# Patient Record
Sex: Male | Born: 1940 | Race: White | Hispanic: No | Marital: Married | Smoking: Current every day smoker
Health system: Southern US, Community
[De-identification: ages and names within clinical notes are randomized; demographics above are authoritative.]

## PROBLEM LIST (undated history)

## (undated) DIAGNOSIS — F32A Depression, unspecified: Secondary | ICD-10-CM

## (undated) DIAGNOSIS — Z720 Tobacco use: Secondary | ICD-10-CM

## (undated) DIAGNOSIS — N400 Enlarged prostate without lower urinary tract symptoms: Secondary | ICD-10-CM

## (undated) DIAGNOSIS — I471 Supraventricular tachycardia: Secondary | ICD-10-CM

## (undated) DIAGNOSIS — Z8673 Personal history of transient ischemic attack (TIA), and cerebral infarction without residual deficits: Secondary | ICD-10-CM

## (undated) DIAGNOSIS — D649 Anemia, unspecified: Secondary | ICD-10-CM

## (undated) DIAGNOSIS — J189 Pneumonia, unspecified organism: Secondary | ICD-10-CM

## (undated) DIAGNOSIS — F329 Major depressive disorder, single episode, unspecified: Secondary | ICD-10-CM

## (undated) DIAGNOSIS — C439 Malignant melanoma of skin, unspecified: Secondary | ICD-10-CM

## (undated) DIAGNOSIS — G8929 Other chronic pain: Secondary | ICD-10-CM

## (undated) HISTORY — PX: SKIN SURGERY: SHX2413

---

## 2004-09-28 ENCOUNTER — Ambulatory Visit: Payer: Self-pay | Admitting: Pain Medicine

## 2004-10-04 ENCOUNTER — Ambulatory Visit: Payer: Self-pay | Admitting: Pain Medicine

## 2004-10-06 ENCOUNTER — Ambulatory Visit: Payer: Self-pay | Admitting: Gastroenterology

## 2004-11-16 ENCOUNTER — Ambulatory Visit: Payer: Self-pay | Admitting: Pain Medicine

## 2004-11-20 ENCOUNTER — Ambulatory Visit: Payer: Self-pay | Admitting: Pain Medicine

## 2005-01-18 ENCOUNTER — Ambulatory Visit: Payer: Self-pay | Admitting: Pain Medicine

## 2005-02-13 ENCOUNTER — Ambulatory Visit: Payer: Self-pay | Admitting: Pain Medicine

## 2005-02-19 ENCOUNTER — Ambulatory Visit: Payer: Self-pay | Admitting: Pain Medicine

## 2005-03-13 ENCOUNTER — Ambulatory Visit: Payer: Self-pay | Admitting: Pain Medicine

## 2005-03-21 ENCOUNTER — Ambulatory Visit: Payer: Self-pay | Admitting: Pain Medicine

## 2005-04-19 ENCOUNTER — Ambulatory Visit: Payer: Self-pay | Admitting: Pain Medicine

## 2005-05-29 ENCOUNTER — Ambulatory Visit: Payer: Self-pay | Admitting: Pain Medicine

## 2005-06-28 ENCOUNTER — Ambulatory Visit: Payer: Self-pay | Admitting: Pain Medicine

## 2005-07-26 ENCOUNTER — Ambulatory Visit: Payer: Self-pay | Admitting: Pain Medicine

## 2005-08-01 ENCOUNTER — Ambulatory Visit: Payer: Self-pay | Admitting: Pain Medicine

## 2005-09-20 ENCOUNTER — Ambulatory Visit: Payer: Self-pay | Admitting: Pain Medicine

## 2005-09-24 ENCOUNTER — Ambulatory Visit: Payer: Self-pay | Admitting: Pain Medicine

## 2005-11-20 ENCOUNTER — Ambulatory Visit: Payer: Self-pay | Admitting: Pain Medicine

## 2005-12-27 ENCOUNTER — Ambulatory Visit: Payer: Self-pay | Admitting: Pain Medicine

## 2006-01-16 ENCOUNTER — Ambulatory Visit: Payer: Self-pay | Admitting: Pain Medicine

## 2006-02-12 ENCOUNTER — Ambulatory Visit: Payer: Self-pay | Admitting: Pain Medicine

## 2006-02-18 ENCOUNTER — Ambulatory Visit: Payer: Self-pay | Admitting: Pain Medicine

## 2006-03-12 ENCOUNTER — Ambulatory Visit: Payer: Self-pay | Admitting: Pain Medicine

## 2006-04-09 ENCOUNTER — Ambulatory Visit: Payer: Self-pay | Admitting: Pain Medicine

## 2006-05-06 ENCOUNTER — Ambulatory Visit: Payer: Self-pay | Admitting: Pain Medicine

## 2006-06-18 ENCOUNTER — Ambulatory Visit: Payer: Self-pay | Admitting: Pain Medicine

## 2006-07-23 ENCOUNTER — Ambulatory Visit: Payer: Self-pay | Admitting: Pain Medicine

## 2006-08-22 ENCOUNTER — Ambulatory Visit: Payer: Self-pay | Admitting: Pain Medicine

## 2006-11-05 ENCOUNTER — Ambulatory Visit: Payer: Self-pay | Admitting: Pain Medicine

## 2006-12-24 ENCOUNTER — Ambulatory Visit: Payer: Self-pay | Admitting: Pain Medicine

## 2007-01-01 ENCOUNTER — Ambulatory Visit: Payer: Self-pay | Admitting: Pain Medicine

## 2007-02-18 ENCOUNTER — Ambulatory Visit: Payer: Self-pay | Admitting: Pain Medicine

## 2007-03-27 ENCOUNTER — Ambulatory Visit: Payer: Self-pay | Admitting: Pain Medicine

## 2007-04-02 ENCOUNTER — Ambulatory Visit: Payer: Self-pay | Admitting: Pain Medicine

## 2007-04-24 ENCOUNTER — Ambulatory Visit: Payer: Self-pay | Admitting: Pain Medicine

## 2007-06-26 ENCOUNTER — Ambulatory Visit: Payer: Self-pay | Admitting: Pain Medicine

## 2007-06-30 ENCOUNTER — Ambulatory Visit: Payer: Self-pay | Admitting: Pain Medicine

## 2007-08-19 ENCOUNTER — Ambulatory Visit: Payer: Self-pay | Admitting: Pain Medicine

## 2007-09-17 ENCOUNTER — Ambulatory Visit: Payer: Self-pay | Admitting: Pain Medicine

## 2007-12-23 ENCOUNTER — Ambulatory Visit: Payer: Self-pay | Admitting: Pain Medicine

## 2007-12-29 ENCOUNTER — Ambulatory Visit: Payer: Self-pay | Admitting: Pain Medicine

## 2008-02-03 ENCOUNTER — Ambulatory Visit: Payer: Self-pay | Admitting: Pain Medicine

## 2008-02-20 ENCOUNTER — Ambulatory Visit: Payer: Self-pay | Admitting: Pain Medicine

## 2008-04-15 ENCOUNTER — Ambulatory Visit: Payer: Self-pay | Admitting: Pain Medicine

## 2008-05-18 ENCOUNTER — Ambulatory Visit: Payer: Self-pay | Admitting: Pain Medicine

## 2008-06-24 ENCOUNTER — Ambulatory Visit: Payer: Self-pay | Admitting: Pain Medicine

## 2008-06-26 ENCOUNTER — Emergency Department: Payer: Self-pay | Admitting: Emergency Medicine

## 2008-07-07 ENCOUNTER — Ambulatory Visit: Payer: Self-pay | Admitting: Internal Medicine

## 2008-08-02 ENCOUNTER — Ambulatory Visit: Payer: Self-pay | Admitting: Pain Medicine

## 2008-09-07 ENCOUNTER — Ambulatory Visit: Payer: Self-pay | Admitting: Pain Medicine

## 2008-09-30 ENCOUNTER — Ambulatory Visit: Payer: Self-pay | Admitting: Pain Medicine

## 2008-10-07 ENCOUNTER — Ambulatory Visit: Payer: Self-pay | Admitting: Internal Medicine

## 2008-10-11 ENCOUNTER — Ambulatory Visit: Payer: Self-pay | Admitting: Pain Medicine

## 2009-02-22 ENCOUNTER — Ambulatory Visit: Payer: Self-pay | Admitting: Pain Medicine

## 2012-01-18 DIAGNOSIS — J189 Pneumonia, unspecified organism: Secondary | ICD-10-CM

## 2012-01-18 DIAGNOSIS — Z8673 Personal history of transient ischemic attack (TIA), and cerebral infarction without residual deficits: Secondary | ICD-10-CM

## 2012-01-18 HISTORY — DX: Pneumonia, unspecified organism: J18.9

## 2012-01-18 HISTORY — DX: Personal history of transient ischemic attack (TIA), and cerebral infarction without residual deficits: Z86.73

## 2012-02-01 ENCOUNTER — Emergency Department (HOSPITAL_COMMUNITY): Payer: Medicare Other

## 2012-02-01 ENCOUNTER — Inpatient Hospital Stay (HOSPITAL_COMMUNITY)
Admission: EM | Admit: 2012-02-01 | Discharge: 2012-02-03 | DRG: 194 | Disposition: A | Discharge status: Home / Self Care | Payer: Medicare Other | Attending: Internal Medicine | Admitting: Internal Medicine

## 2012-02-01 ENCOUNTER — Other Ambulatory Visit: Payer: Self-pay

## 2012-02-01 ENCOUNTER — Encounter (HOSPITAL_COMMUNITY): Payer: Self-pay

## 2012-02-01 DIAGNOSIS — Z7982 Long term (current) use of aspirin: Secondary | ICD-10-CM

## 2012-02-01 DIAGNOSIS — D649 Anemia, unspecified: Secondary | ICD-10-CM | POA: Diagnosis not present

## 2012-02-01 DIAGNOSIS — R410 Disorientation, unspecified: Secondary | ICD-10-CM

## 2012-02-01 DIAGNOSIS — Z885 Allergy status to narcotic agent status: Secondary | ICD-10-CM

## 2012-02-01 DIAGNOSIS — M25519 Pain in unspecified shoulder: Secondary | ICD-10-CM | POA: Diagnosis present

## 2012-02-01 DIAGNOSIS — N400 Enlarged prostate without lower urinary tract symptoms: Secondary | ICD-10-CM | POA: Diagnosis present

## 2012-02-01 DIAGNOSIS — F3289 Other specified depressive episodes: Secondary | ICD-10-CM | POA: Diagnosis present

## 2012-02-01 DIAGNOSIS — J189 Pneumonia, unspecified organism: Principal | ICD-10-CM | POA: Diagnosis present

## 2012-02-01 DIAGNOSIS — Z8673 Personal history of transient ischemic attack (TIA), and cerebral infarction without residual deficits: Secondary | ICD-10-CM

## 2012-02-01 DIAGNOSIS — G8929 Other chronic pain: Secondary | ICD-10-CM | POA: Diagnosis present

## 2012-02-01 DIAGNOSIS — E236 Other disorders of pituitary gland: Secondary | ICD-10-CM | POA: Diagnosis present

## 2012-02-01 DIAGNOSIS — Z720 Tobacco use: Secondary | ICD-10-CM | POA: Diagnosis present

## 2012-02-01 DIAGNOSIS — R5383 Other fatigue: Secondary | ICD-10-CM | POA: Diagnosis present

## 2012-02-01 DIAGNOSIS — F329 Major depressive disorder, single episode, unspecified: Secondary | ICD-10-CM | POA: Diagnosis present

## 2012-02-01 DIAGNOSIS — R413 Other amnesia: Secondary | ICD-10-CM | POA: Diagnosis present

## 2012-02-01 DIAGNOSIS — J449 Chronic obstructive pulmonary disease, unspecified: Secondary | ICD-10-CM | POA: Diagnosis present

## 2012-02-01 DIAGNOSIS — R0902 Hypoxemia: Secondary | ICD-10-CM | POA: Diagnosis present

## 2012-02-01 DIAGNOSIS — T50905A Adverse effect of unspecified drugs, medicaments and biological substances, initial encounter: Secondary | ICD-10-CM

## 2012-02-01 DIAGNOSIS — J4489 Other specified chronic obstructive pulmonary disease: Secondary | ICD-10-CM | POA: Diagnosis present

## 2012-02-01 DIAGNOSIS — R5381 Other malaise: Secondary | ICD-10-CM | POA: Diagnosis present

## 2012-02-01 DIAGNOSIS — Z85820 Personal history of malignant melanoma of skin: Secondary | ICD-10-CM

## 2012-02-01 DIAGNOSIS — Z79899 Other long term (current) drug therapy: Secondary | ICD-10-CM

## 2012-02-01 DIAGNOSIS — R531 Weakness: Secondary | ICD-10-CM | POA: Diagnosis present

## 2012-02-01 DIAGNOSIS — F17200 Nicotine dependence, unspecified, uncomplicated: Secondary | ICD-10-CM | POA: Diagnosis present

## 2012-02-01 DIAGNOSIS — E860 Dehydration: Secondary | ICD-10-CM

## 2012-02-01 DIAGNOSIS — E871 Hypo-osmolality and hyponatremia: Secondary | ICD-10-CM | POA: Diagnosis present

## 2012-02-01 HISTORY — DX: Personal history of transient ischemic attack (TIA), and cerebral infarction without residual deficits: Z86.73

## 2012-02-01 HISTORY — DX: Anemia, unspecified: D64.9

## 2012-02-01 HISTORY — DX: Tobacco use: Z72.0

## 2012-02-01 HISTORY — DX: Benign prostatic hyperplasia without lower urinary tract symptoms: N40.0

## 2012-02-01 HISTORY — DX: Major depressive disorder, single episode, unspecified: F32.9

## 2012-02-01 HISTORY — DX: Other chronic pain: G89.29

## 2012-02-01 HISTORY — DX: Malignant melanoma of skin, unspecified: C43.9

## 2012-02-01 HISTORY — DX: Depression, unspecified: F32.A

## 2012-02-01 HISTORY — DX: Pneumonia, unspecified organism: J18.9

## 2012-02-01 LAB — URINALYSIS, ROUTINE W REFLEX MICROSCOPIC
Glucose, UA: NEGATIVE mg/dL
Leukocytes, UA: NEGATIVE
Specific Gravity, Urine: 1.025 (ref 1.005–1.030)
pH: 5.5 (ref 5.0–8.0)

## 2012-02-01 LAB — CBC
HCT: 42.3 % (ref 39.0–52.0)
Hemoglobin: 14.7 g/dL (ref 13.0–17.0)
RDW: 19.2 % — ABNORMAL HIGH (ref 11.5–15.5)
WBC: 13.4 10*3/uL — ABNORMAL HIGH (ref 4.0–10.5)

## 2012-02-01 LAB — COMPREHENSIVE METABOLIC PANEL
ALT: 10 U/L (ref 0–53)
AST: 16 U/L (ref 0–37)
Albumin: 3.6 g/dL (ref 3.5–5.2)
CO2: 28 mEq/L (ref 19–32)
Calcium: 10 mg/dL (ref 8.4–10.5)
GFR calc non Af Amer: 67 mL/min — ABNORMAL LOW (ref >90)
Sodium: 131 mEq/L — ABNORMAL LOW (ref 135–145)

## 2012-02-01 LAB — URINE MICROSCOPIC-ADD ON

## 2012-02-01 LAB — DIFFERENTIAL
Band Neutrophils: 0 % (ref 0–10)
Basophils Relative: 0 % (ref 0–1)
LUC, Absolute: 0 10*3/uL (ref 0.0–0.5)
Lymphs Abs: 1.7 10*3/uL (ref 0.7–4.0)
Monocytes Relative: 12 % (ref 3–12)
Neutro Abs: 10.1 10*3/uL — ABNORMAL HIGH (ref 1.7–7.7)

## 2012-02-01 MED ORDER — DEXTROSE 5 % IV SOLN
1.0000 g | Freq: Once | INTRAVENOUS | Status: AC
  Administered 2012-02-01: 1 g via INTRAVENOUS
  Filled 2012-02-01: qty 10

## 2012-02-01 MED ORDER — SODIUM CHLORIDE 0.9 % IV SOLN
INTRAVENOUS | Status: DC
  Administered 2012-02-02 – 2012-02-03 (×2): via INTRAVENOUS

## 2012-02-01 MED ORDER — SODIUM CHLORIDE 0.9 % IV BOLUS (SEPSIS)
700.0000 mL | Freq: Once | INTRAVENOUS | Status: AC
  Administered 2012-02-01: 700 mL via INTRAVENOUS

## 2012-02-01 MED ORDER — KETOROLAC TROMETHAMINE 30 MG/ML IJ SOLN
30.0000 mg | Freq: Once | INTRAMUSCULAR | Status: AC
  Administered 2012-02-01: 30 mg via INTRAVENOUS
  Filled 2012-02-01: qty 1

## 2012-02-01 MED ORDER — AZITHROMYCIN 250 MG PO TABS
500.0000 mg | ORAL_TABLET | Freq: Once | ORAL | Status: AC
  Administered 2012-02-01: 500 mg via ORAL
  Filled 2012-02-01: qty 2

## 2012-02-01 NOTE — ED Provider Notes (Cosign Needed)
History     CSN: [id number]  Arrival date & time 02/01/12  1821   First MD Initiated Contact with Patient 02/01/12 1909      Chief Complaint  Patient presents with  . Anxiety  . Tremors  . Altered Mental Status    (Consider location/radiation/quality/duration/timing/severity/associated sxs/prior treatment) HPI  Patient here if his wife and son. His wife relates she's had memory problems since last summer. She relates at times he sees seems unsteady on his feet. They were seen by their physician about 3 weeks ago approximately January 21 for his forgetfulness and he was started on Wellbutrin, Neurontin, and Flomax. He relates he was having difficulty with urinary incontinence which has been improved with the Flomax. However family have noticed in the past 7 days he is having worsening memory problems. They state he thinks he ate when he didn't or didn't take his medicine and he did. He also has been stumbling and dragging his feet and shuffling. He seemed anxious and has been having some tremor. Patient's noted to have a cough but they deny fever. He also has not been eating well.  PCP Dr. Graciela Husbands in [place]  Past Medical History  Diagnosis Date  . Chronic pain    melanoma removed from 2 areas on his left shoulder about 2006. 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 No pertinent past surgical history.   No family history on file.  History  Substance Use Topics  . Smoking status: Current Everyday Smoker -- 2.0 packs/day  . Smokeless tobacco: Not on file  . Alcohol Use: Yes     occasional   retired    Review of Systems  All other systems reviewed and are negative.    Allergies  Morphine and related  Home Medications   Current Outpatient Rx  Name Route Sig Dispense Refill  . BUPROPION HCL ER (SR) 150 MG PO TB12 Oral Take 150 mg by mouth 2 (two) times daily.    Marland Kitchen DIPHENHYDRAMINE-APAP (SLEEP) 25-500 MG PO TABS Oral Take 1 tablet by mouth at bedtime as needed.  For sleep/pain    . GABAPENTIN 300 MG PO CAPS Oral Take 300 mg by mouth 5 (five) times daily.    Marland Kitchen TAMSULOSIN HCL 0.4 MG PO CAPS Oral Take 0.4 mg by mouth daily.       BP 139/96  Pulse 40  Temp(Src) 98.5 F (36.9 C) (Oral)  Resp 20  Ht 5\' 11"  (1.803 m)  Wt 138 lb (62.596 kg)  BMI 19.25 kg/m2  SpO2 93%  Vital signs normal    Physical Exam  Nursing note and vitals reviewed. Constitutional: He is oriented to person, place, and time.  Non-toxic appearance. He does not appear ill. No distress.       Thin elderly male who is awake and alert, hyperactive  HENT:  Head: Normocephalic and atraumatic.  Right Ear: External ear normal.  Left Ear: External ear normal.  Nose: Nose normal. No mucosal edema or rhinorrhea.  Mouth/Throat: Mucous membranes are normal. No dental abscesses or uvula swelling.       Glenna Durand is dry  Eyes: Conjunctivae and EOM are normal. Pupils are equal, round, and reactive to light.  Neck: Normal range of motion and full passive range of motion without pain. Neck supple.  Cardiovascular: Normal rate, regular rhythm and normal heart sounds.  Exam reveals no gallop and no friction rub.   No murmur heard. Pulmonary/Chest: Effort normal and breath sounds normal. No respiratory distress.  He has no wheezes. He has no rhonchi. He has no rales. He exhibits no tenderness and no crepitus.         During his exam patient put my hand on his left lateral chest wall and states he's been having pain there. Is no masses felt no crepitus felt  Abdominal: Soft. Normal appearance and bowel sounds are normal. He exhibits no distension. There is no tenderness. There is no rebound and no guarding.  Musculoskeletal: Normal range of motion. He exhibits no edema and no tenderness.       Moves all extremities well.   Neurological: He is alert and oriented to person, place, and time. He has normal strength. No cranial nerve deficit.       Patient has no focal neurological deficit. He has some  minor difficulty with finger to nose finger to finger with the left hand with some mild tremor. Patient noted to ambulate without difficulty in the room  Skin: Skin is warm, dry and intact. No rash noted. No erythema. No pallor.  Psychiatric: His speech is normal and behavior is normal. His mood appears not anxious.       Patient seems a little hyperactive    ED Course  Procedures (including critical care time)   Medications  sodium chloride 0.9 % bolus 700 mL (not administered)  0.9 %  sodium chloride infusion (not administered)  cefTRIAXone (ROCEPHIN) 1 g in dextrose 5 % 50 mL IVPB (not administered)  ketorolac (TORADOL) 30 MG/ML injection 30 mg (not administered)  azithromycin (ZITHROMAX) tablet 500 mg (not administered)   21:57 Dr Rito Ehrlich will admit to tele    Results for orders placed during the hospital encounter of 02/01/12  URINALYSIS, ROUTINE W REFLEX MICROSCOPIC      Component Value Range   Color, Urine YELLOW  YELLOW    APPearance CLEAR  CLEAR    Specific Gravity, Urine 1.025  1.005 - 1.030    pH 5.5  5.0 - 8.0    Glucose, UA NEGATIVE  NEGATIVE (mg/dL)   Hgb urine dipstick TRACE (*) NEGATIVE    Bilirubin Urine NEGATIVE  NEGATIVE    Ketones, ur TRACE (*) NEGATIVE (mg/dL)   Protein, ur TRACE (*) NEGATIVE (mg/dL)   Urobilinogen, UA 0.2  0.0 - 1.0 (mg/dL)   Nitrite NEGATIVE  NEGATIVE    Leukocytes, UA NEGATIVE  NEGATIVE   CBC      Component Value Range   WBC 13.4 (*) 4.0 - 10.5 (K/uL)   RBC 4.28  4.22 - 5.81 (MIL/uL)   Hemoglobin 14.7  13.0 - 17.0 (g/dL)   HCT 78.2  95.6 - 21.3 (%)   MCV 98.8  78.0 - 100.0 (fL)   MCH 34.3 (*) 26.0 - 34.0 (pg)   MCHC 34.8  30.0 - 36.0 (g/dL)   RDW 08.6 (*) 57.8 - 15.5 (%)   Platelets 301  150 - 400 (K/uL)  DIFFERENTIAL      Component Value Range   Neutrophils Relative 75  43 - 77 (%)   Neutro Abs 10.1 (*) 1.7 - 7.7 (K/uL)   Band Neutrophils 0  0 - 10 (%)   Lymphocytes Relative 12  12 - 46 (%)   Lymphs Abs 1.7  0.7 - 4.0  (K/uL)   Monocytes Relative 12  3 - 12 (%)   Monocytes Absolute 1.6 (*) 0.1 - 1.0 (K/uL)   Eosinophils Relative 0  0 - 5 (%)   Eosinophils Absolute 0.0  0.0 - 0.7 (K/uL)  Basophils Relative 0  0 - 1 (%)   Basophils Absolute 0.0  0.0 - 0.1 (K/uL)   LUCs, % 0  0 - 4 (%)   LUC, Absolute 0  0.0 - 0.5 (K/uL)  COMPREHENSIVE METABOLIC PANEL      Component Value Range   Sodium 131 (*) 135 - 145 (mEq/L)   Potassium 4.0  3.5 - 5.1 (mEq/L)   Chloride 92 (*) 96 - 112 (mEq/L)   CO2 28  19 - 32 (mEq/L)   Glucose, Bld 123 (*) 70 - 99 (mg/dL)   BUN 18  6 - 23 (mg/dL)   Creatinine, Ser 9.14  0.50 - 1.35 (mg/dL)   Calcium 78.2  8.4 - 10.5 (mg/dL)   Total Protein 7.7  6.0 - 8.3 (g/dL)   Albumin 3.6  3.5 - 5.2 (g/dL)   AST 16  0 - 37 (U/L)   ALT 10  0 - 53 (U/L)   Alkaline Phosphatase 131 (*) 39 - 117 (U/L)   Total Bilirubin 0.7  0.3 - 1.2 (mg/dL)   GFR calc non Af Amer 67 (*) >90 (mL/min)   GFR calc Af Amer 78 (*) >90 (mL/min)  URINE MICROSCOPIC-ADD ON      Component Value Range   Squamous Epithelial / LPF FEW (*) RARE    WBC, UA 0-2  <3 (WBC/hpf)   RBC / HPF 0-2  <3 (RBC/hpf)   Bacteria, UA RARE  RARE    Laboratory interpretation all normal except leukocytosis, hyponatremia, low chloride, mild hyperglycemia    Dg Chest 2 View  02/01/2012  *RADIOLOGY REPORT*  Clinical Data: Cough.  Shortness of breath and dizziness.  Smoker.  CHEST - 2 VIEW  Comparison: None.  Findings: Underlying hyperinflation/COPD. Midline trachea.  Normal heart size and mediastinal contours for age.  No pleural effusion or pneumothorax.  Probable mild scarring at the apices.  Patchy airspace disease at the right lung base.  IMPRESSION:  1.  Patchy right lower lobe airspace disease, suspicious for infection.  Consider short-term radiographic follow-up. 2.  Underlying COPD/hyperinflation with probable scarring at the apices.  Original Report Authenticated By: Consuello Bossier, M.D.   Ct Head Wo Contrast  02/01/2012   *RADIOLOGY REPORT*  Clinical Data: Confused and weakness.  History of melanoma.  CT HEAD WITHOUT CONTRAST  Technique:  Contiguous axial images were obtained from the base of the skull through the vertex without contrast.  Comparison: None.  Findings: Bone windows demonstrate clear paranasal sinuses and mastoid air cells.  Soft tissue windows demonstrate mildly age advanced cerebral atrophy.  Mild low density in the periventricular white matter likely related to small vessel disease.Remote lacunar infarct in the left caudate head and basil ganglia.  Remote lacunar infarct versus focal atrophy in the right cerebellar hemisphere on image 10.  No hemorrhage, hydrocephalus, acute infarct, intra-axial, or extra-axial fluid collections.  IMPRESSION:  1. No acute intracranial abnormality. 2. Cerebral atrophy and small vessel ischemic change. 3.  Remote lacunar infarct or infarcts.  Original Report Authenticated By: Consuello Bossier, M.D.     Date: 02/01/2012  Rate: 107  Rhythm: normal sinus rhythm  QRS Axis: normal  Intervals: normal  ST/T Wave abnormalities: normal  Conduction Disutrbances:none  Narrative Interpretation: PAC's  Old EKG Reviewed: none available     1. Pneumonia, community acquired   2. Hyponatremia   3. Dehydration   4. Confusion   5. Medication side effects present     Plan admission  Devoria Albe, MD, Armando Gang  MDM          Ward Givens, MD 02/01/12 2204  Ward Givens, MD 02/01/12 2223

## 2012-02-01 NOTE — ED Notes (Signed)
Pt brought in by family for change in mental status, anxiety, and muscle tremors x 5 days. Per family pt was placed on Wellbutrin, Neurontin, and Tamsulosin. In triage pt conversation did not make sense.

## 2012-02-01 NOTE — H&P (Signed)
Corey Hughes is an 71 y.o. male.    PCP: Ricci Barker., MD, MD   Chief Complaint: Weakness, cough, memory problems  HPI: This is a 71 year old, Caucasian male, with a past medical history that is quite unremarkable except for newly diagnosed BPH, for which he was started on Flomax recently. Patient was also diagnosed with depression recently and was started on Wellbutrin. Patient has a history of chronic shoulder pain, for which he uses Neurontin.  Patient is accompanied by his wife, who provides some of the history. Apparently, patient has been having some memory issues for the last year or so. They took him to the doctor in January for this forgetfulness. Apparently, patient passed the dementia screening test. He was put on welbutrin. Since then the memory issues has been getting worse. He's also had a couple of instances where his legs give out. He's had generalized weakness for the last couple of weeks. He does mention some left-sided weakness as well. He has some left lower back pain as well. Has had a cough for the last couple of weeks with a yellowish expectoration. Denies any fever, but has had chills. Denies any nausea, vomiting, or shortness of breath. With the cough he does have some left-sided chest pain. Because the symptoms were not improving and the getting worse he decided to come to the hospital.   Home Medications: Prior to Admission medications   Medication Sig Start Date End Date Taking? Authorizing Provider  buPROPion (WELLBUTRIN SR) 150 MG 12 hr tablet Take 150 mg by mouth 2 (two) times daily.   Yes Historical Provider, MD  diphenhydramine-acetaminophen (TYLENOL PM) 25-500 MG TABS Take 1 tablet by mouth at bedtime as needed. For sleep/pain   Yes Historical Provider, MD  gabapentin (NEURONTIN) 300 MG capsule Take 300 mg by mouth 5 (five) times daily.   Yes Historical Provider, MD  Tamsulosin HCl (FLOMAX) 0.4 MG CAPS Take 0.4 mg by mouth daily.    Yes Historical Provider,  MD    Allergies:  Allergies  Allergen Reactions  . Morphine And Related Shortness Of Breath    Past Medical History: Past Medical History  Diagnosis Date  . Chronic pain   . BPH (benign prostatic hyperplasia)   . Melanoma     Past Surgical History  Procedure Date  . Skin surgery     for melanoma    Social History:  reports that he has been smoking.  He does not have any smokeless tobacco history on file. He reports that he drinks alcohol. He reports that he does not use illicit drugs.  Family History: Patient denies any medical problems in the family  Review of Systems - History obtained from the patient General ROS: positive for  - malaise Psychological ROS: negative Ophthalmic ROS: negative ENT ROS: negative Allergy and Immunology ROS: negative Hematological and Lymphatic ROS: negative Endocrine ROS: negative Respiratory ROS: as in hpi Cardiovascular ROS: as in hpi Gastrointestinal ROS: negative Genito-Urinary ROS: negative Musculoskeletal ROS: negative Neurological ROS: as in hpi Dermatological ROS: negative  Physical Examination Blood pressure 139/96, pulse 40, temperature 98.5 F (36.9 C), temperature source Oral, resp. rate 20, height 5\' 11"  (1.803 m), weight 62.596 kg (138 lb), SpO2 93.00%.  General appearance: alert, cooperative and no distress Head: Normocephalic, without obvious abnormality, atraumatic Eyes: conjunctivae/corneas clear. PERRL, EOM's intact. Fundi benign. Throat: lips, mucosa, and tongue normal; teeth and gums normal Neck: no adenopathy, no carotid bruit, no JVD, supple, symmetrical, trachea midline and thyroid not enlarged,  symmetric, no tenderness/mass/nodules Back: symmetric, no curvature. ROM normal. No CVA tenderness. Resp: Decreased air entry was noted at the bases. No definite crackles are present. No wheezing was appreciated. Cardio: regular rate and rhythm, S1, S2 normal, no murmur, click, rub or gallop GI: soft, non-tender;  bowel sounds normal; no masses,  no organomegaly Extremities: extremities normal, atraumatic, no cyanosis or edema Pulses: 2+ and symmetric Skin: Skin color, texture, turgor normal. No rashes or lesions Lymph nodes: Cervical, supraclavicular, and axillary nodes normal. Neurologic:. He was oriented to place, person, and time. No focal neurological deficits were appreciated.  Laboratory Data: Results for orders placed during the hospital encounter of 02/01/12 (from the past 48 hour(s))  CBC     Status: Abnormal   Collection Time   02/01/12  8:20 PM      Component Value Range Comment   WBC 13.4 (*) 4.0 - 10.5 (K/uL)    RBC 4.28  4.22 - 5.81 (MIL/uL)    Hemoglobin 14.7  13.0 - 17.0 (g/dL)    HCT 16.1  09.6 - 04.5 (%)    MCV 98.8  78.0 - 100.0 (fL)    MCH 34.3 (*) 26.0 - 34.0 (pg)    MCHC 34.8  30.0 - 36.0 (g/dL)    RDW 40.9 (*) 81.1 - 15.5 (%)    Platelets 301  150 - 400 (K/uL)   DIFFERENTIAL     Status: Abnormal   Collection Time   02/01/12  8:20 PM      Component Value Range Comment   Neutrophils Relative 75  43 - 77 (%)    Neutro Abs 10.1 (*) 1.7 - 7.7 (K/uL)    Band Neutrophils 0  0 - 10 (%)    Lymphocytes Relative 12  12 - 46 (%)    Lymphs Abs 1.7  0.7 - 4.0 (K/uL)    Monocytes Relative 12  3 - 12 (%)    Monocytes Absolute 1.6 (*) 0.1 - 1.0 (K/uL)    Eosinophils Relative 0  0 - 5 (%)    Eosinophils Absolute 0.0  0.0 - 0.7 (K/uL)    Basophils Relative 0  0 - 1 (%)    Basophils Absolute 0.0  0.0 - 0.1 (K/uL)    LUCs, % 0  0 - 4 (%)    LUC, Absolute 0  0.0 - 0.5 (K/uL)   COMPREHENSIVE METABOLIC PANEL     Status: Abnormal   Collection Time   02/01/12  8:20 PM      Component Value Range Comment   Sodium 131 (*) 135 - 145 (mEq/L)    Potassium 4.0  3.5 - 5.1 (mEq/L)    Chloride 92 (*) 96 - 112 (mEq/L)    CO2 28  19 - 32 (mEq/L)    Glucose, Bld 123 (*) 70 - 99 (mg/dL)    BUN 18  6 - 23 (mg/dL)    Creatinine, Ser 9.14  0.50 - 1.35 (mg/dL)    Calcium 78.2  8.4 - 10.5 (mg/dL)     Total Protein 7.7  6.0 - 8.3 (g/dL)    Albumin 3.6  3.5 - 5.2 (g/dL)    AST 16  0 - 37 (U/L)    ALT 10  0 - 53 (U/L)    Alkaline Phosphatase 131 (*) 39 - 117 (U/L)    Total Bilirubin 0.7  0.3 - 1.2 (mg/dL)    GFR calc non Af Amer 67 (*) >90 (mL/min)    GFR calc Af Amer 78 (*) >  90 (mL/min)   URINALYSIS, ROUTINE W REFLEX MICROSCOPIC     Status: Abnormal   Collection Time   02/01/12  8:52 PM      Component Value Range Comment   Color, Urine YELLOW  YELLOW     APPearance CLEAR  CLEAR     Specific Gravity, Urine 1.025  1.005 - 1.030     pH 5.5  5.0 - 8.0     Glucose, UA NEGATIVE  NEGATIVE (mg/dL)    Hgb urine dipstick TRACE (*) NEGATIVE     Bilirubin Urine NEGATIVE  NEGATIVE     Ketones, ur TRACE (*) NEGATIVE (mg/dL)    Protein, ur TRACE (*) NEGATIVE (mg/dL)    Urobilinogen, UA 0.2  0.0 - 1.0 (mg/dL)    Nitrite NEGATIVE  NEGATIVE     Leukocytes, UA NEGATIVE  NEGATIVE    URINE MICROSCOPIC-ADD ON     Status: Abnormal   Collection Time   02/01/12  8:52 PM      Component Value Range Comment   Squamous Epithelial / LPF FEW (*) RARE     WBC, UA 0-2  <3 (WBC/hpf)    RBC / HPF 0-2  <3 (RBC/hpf)    Bacteria, UA RARE  RARE      Radiology Reports: Dg Chest 2 View  02/01/2012  *RADIOLOGY REPORT*  Clinical Data: Cough.  Shortness of breath and dizziness.  Smoker.  CHEST - 2 VIEW  Comparison: None.  Findings: Underlying hyperinflation/COPD. Midline trachea.  Normal heart size and mediastinal contours for age.  No pleural effusion or pneumothorax.  Probable mild scarring at the apices.  Patchy airspace disease at the right lung base.  IMPRESSION:  1.  Patchy right lower lobe airspace disease, suspicious for infection.  Consider short-term radiographic follow-up. 2.  Underlying COPD/hyperinflation with probable scarring at the apices.  Original Report Authenticated By: Consuello Bossier, M.D.   Ct Head Wo Contrast  02/01/2012  *RADIOLOGY REPORT*  Clinical Data: Confused and weakness.  History of  melanoma.  CT HEAD WITHOUT CONTRAST  Technique:  Contiguous axial images were obtained from the base of the skull through the vertex without contrast.  Comparison: None.  Findings: Bone windows demonstrate clear paranasal sinuses and mastoid air cells.  Soft tissue windows demonstrate mildly age advanced cerebral atrophy.  Mild low density in the periventricular white matter likely related to small vessel disease.Remote lacunar infarct in the left caudate head and basil ganglia.  Remote lacunar infarct versus focal atrophy in the right cerebellar hemisphere on image 10.  No hemorrhage, hydrocephalus, acute infarct, intra-axial, or extra-axial fluid collections.  IMPRESSION:  1. No acute intracranial abnormality. 2. Cerebral atrophy and small vessel ischemic change. 3.  Remote lacunar infarct or infarcts.  Original Report Authenticated By: Consuello Bossier, M.D.    Electrocardiogram: EKG shows sinus tachycardia at 107 beats per minute. Axis is normal. Intervals appear to be in the normal range. No Q waves. No concerning ST or T-wave changes are noted. There is some PACs seen on this EKG.  Assessment/Plan  Principal Problem:  *CAP (community acquired pneumonia) Active Problems:  Hyponatremia  Generalized weakness  Memory loss, short term   #1 community-acquired pneumonia: Patient will be treated with ceftriaxone and azithromycin. Smoking cessation counseling will be provided. Chest is likely due to the pneumonia. EKG was unremarkable. Will check a single set of cardiac enzymes.  #2 memory impairment: Etiology for this is unclear. CT scan suggests previous history of strokes. Patient could have vascular  dementia. We will do a dementia workup. Get MRI of his brain as well. Wellbutrin may have made his symptoms worse, so, we will go ahead and discontinue it.  #3 generalized weakness. I did not appreciate any lower extremity weakness on my physical examination. He does not have any point tenderness on the  spine. At this point we will hold off any workup for his back pain. We'll await the MRI of the brain. We will await physical therapy and occupational therapy evaluation and then proceed.  #4 hyponatremia: He'll be given gentle IV hydration.  DVT, prophylaxis will be provided.  Is a full code.  Further management decisions will depend on results of further testing and patient's response to treatment.  [facility]  [facility] Pager [phone]  02/01/2012, 10:34 PM

## 2012-02-02 ENCOUNTER — Encounter (HOSPITAL_COMMUNITY): Payer: Self-pay | Admitting: *Deleted

## 2012-02-02 DIAGNOSIS — Z720 Tobacco use: Secondary | ICD-10-CM | POA: Diagnosis present

## 2012-02-02 DIAGNOSIS — D649 Anemia, unspecified: Secondary | ICD-10-CM | POA: Diagnosis not present

## 2012-02-02 DIAGNOSIS — R0902 Hypoxemia: Secondary | ICD-10-CM | POA: Diagnosis present

## 2012-02-02 LAB — COMPREHENSIVE METABOLIC PANEL
Albumin: 2.8 g/dL — ABNORMAL LOW (ref 3.5–5.2)
Alkaline Phosphatase: 132 U/L — ABNORMAL HIGH (ref 39–117)
BUN: 18 mg/dL (ref 6–23)
CO2: 26 mEq/L (ref 19–32)
Chloride: 99 mEq/L (ref 96–112)
Creatinine, Ser: 0.98 mg/dL (ref 0.50–1.35)
GFR calc Af Amer: >90 mL/min (ref >90)
GFR calc non Af Amer: 81 mL/min — ABNORMAL LOW (ref >90)
Glucose, Bld: 96 mg/dL (ref 70–99)
Potassium: 3.8 mEq/L (ref 3.5–5.1)
Total Bilirubin: 0.5 mg/dL (ref 0.3–1.2)

## 2012-02-02 LAB — CBC
MCV: 98.4 fL (ref 78.0–100.0)
Platelets: 303 10*3/uL (ref 150–400)
RDW: 19 % — ABNORMAL HIGH (ref 11.5–15.5)
WBC: 9.7 10*3/uL (ref 4.0–10.5)

## 2012-02-02 LAB — VITAMIN B12: Vitamin B-12: 1361 pg/mL — ABNORMAL HIGH (ref 211–911)

## 2012-02-02 MED ORDER — NICOTINE 14 MG/24HR TD PT24: 14.0000 mg | MEDICATED_PATCH | Freq: Every day | TRANSDERMAL | Status: DC

## 2012-02-02 MED ORDER — ACETAMINOPHEN 325 MG PO TABS
650.0000 mg | ORAL_TABLET | Freq: Four times a day (QID) | ORAL | Status: DC | PRN
  Administered 2012-02-02 – 2012-02-03 (×2): 650 mg via ORAL
  Filled 2012-02-02 (×2): qty 2

## 2012-02-02 MED ORDER — ALBUTEROL SULFATE (5 MG/ML) 0.5% IN NEBU
2.5000 mg | INHALATION_SOLUTION | RESPIRATORY_TRACT | Status: DC | PRN
  Administered 2012-02-02: 2.5 mg via RESPIRATORY_TRACT
  Filled 2012-02-02: qty 0.5

## 2012-02-02 MED ORDER — VITAMIN B-1 100 MG PO TABS
100.0000 mg | ORAL_TABLET | Freq: Every day | ORAL | Status: DC
  Administered 2012-02-02 – 2012-02-03 (×2): 100 mg via ORAL
  Filled 2012-02-02 (×2): qty 1

## 2012-02-02 MED ORDER — ONDANSETRON HCL 4 MG PO TABS: 4.0000 mg | ORAL_TABLET | Freq: Four times a day (QID) | ORAL | Status: DC | PRN

## 2012-02-02 MED ORDER — NICOTINE 14 MG/24HR TD PT24
14.0000 mg | MEDICATED_PATCH | Freq: Every day | TRANSDERMAL | Status: DC
  Administered 2012-02-02 (×2): 14 mg via TRANSDERMAL
  Filled 2012-02-02 (×2): qty 1

## 2012-02-02 MED ORDER — SODIUM CHLORIDE 0.9 % IV SOLN
INTRAVENOUS | Status: DC
  Administered 2012-02-02: 02:00:00 via INTRAVENOUS

## 2012-02-02 MED ORDER — ACETAMINOPHEN 650 MG RE SUPP: 650.0000 mg | Freq: Four times a day (QID) | RECTAL | Status: DC | PRN

## 2012-02-02 MED ORDER — DEXTROSE 5 % IV SOLN
1.0000 g | INTRAVENOUS | Status: DC
  Administered 2012-02-03: 1 g via INTRAVENOUS
  Filled 2012-02-02 (×3): qty 10

## 2012-02-02 MED ORDER — IPRATROPIUM BROMIDE 0.02 % IN SOLN
0.5000 mg | Freq: Three times a day (TID) | RESPIRATORY_TRACT | Status: DC
  Administered 2012-02-02 – 2012-02-03 (×3): 0.5 mg via RESPIRATORY_TRACT
  Filled 2012-02-02 (×3): qty 2.5

## 2012-02-02 MED ORDER — ALBUTEROL SULFATE (5 MG/ML) 0.5% IN NEBU
2.5000 mg | INHALATION_SOLUTION | Freq: Three times a day (TID) | RESPIRATORY_TRACT | Status: DC
  Administered 2012-02-02 – 2012-02-03 (×3): 2.5 mg via RESPIRATORY_TRACT
  Filled 2012-02-02 (×3): qty 0.5

## 2012-02-02 MED ORDER — ASPIRIN EC 325 MG PO TBEC
325.0000 mg | DELAYED_RELEASE_TABLET | Freq: Every day | ORAL | Status: DC
  Administered 2012-02-02 – 2012-02-03 (×2): 325 mg via ORAL
  Filled 2012-02-02 (×2): qty 1

## 2012-02-02 MED ORDER — GABAPENTIN 300 MG PO CAPS
300.0000 mg | ORAL_CAPSULE | Freq: Three times a day (TID) | ORAL | Status: DC
Start: 2012-02-02 — End: 2012-02-03
  Administered 2012-02-02 – 2012-02-03 (×4): 300 mg via ORAL
  Filled 2012-02-02 (×8): qty 1

## 2012-02-02 MED ORDER — AZITHROMYCIN 250 MG PO TABS
500.0000 mg | ORAL_TABLET | Freq: Every day | ORAL | Status: DC
  Administered 2012-02-02 – 2012-02-03 (×2): 500 mg via ORAL
  Filled 2012-02-02 (×2): qty 2

## 2012-02-02 MED ORDER — ONDANSETRON HCL 4 MG/2ML IJ SOLN: 4.0000 mg | Freq: Four times a day (QID) | INTRAMUSCULAR | Status: DC | PRN

## 2012-02-02 MED ORDER — ENOXAPARIN SODIUM 40 MG/0.4ML ~~LOC~~ SOLN
40.0000 mg | SUBCUTANEOUS | Status: DC
  Administered 2012-02-02 – 2012-02-03 (×2): 40 mg via SUBCUTANEOUS
  Filled 2012-02-02 (×2): qty 0.4

## 2012-02-02 MED ORDER — SODIUM CHLORIDE 0.9 % IJ SOLN
3.0000 mL | Freq: Two times a day (BID) | INTRAMUSCULAR | Status: DC
  Administered 2012-02-02 – 2012-02-03 (×2): 3 mL via INTRAVENOUS
  Filled 2012-02-02: qty 3

## 2012-02-02 MED ORDER — TAMSULOSIN HCL 0.4 MG PO CAPS
0.4000 mg | ORAL_CAPSULE | Freq: Every day | ORAL | Status: DC
  Administered 2012-02-02 – 2012-02-03 (×2): 0.4 mg via ORAL
  Filled 2012-02-02 (×4): qty 1

## 2012-02-02 NOTE — Progress Notes (Signed)
02/01/12 1804 patient ambulated in hallway from his room past nurses station to opposite end of hallway and back this evening. Tolerated fairly well, denied shortness of breath. O2 sats at 89% on r/a during ambulation, O2 reapplied at 1.5 lpm on return to room. General weakness and some shakiness during ambulation, pt states has had shakiness for about 3 weeks or so. Wife at bedside. Smoking cessation information given and discussed with patient. Nursing to monitor.

## 2012-02-02 NOTE — Progress Notes (Signed)
02/02/12 1619 Patient noted to have O2 sats at 87-88% this afternoon, O2 at 1.5 lpm per RT. Notified Dr Sherrie Mustache, order for incentive spirometry requested and received, IS education discussed with patient per nursing and RT. Starting to have productive cough with yellow phlegm. Denies shortness of breath at this time. Will monitor.

## 2012-02-02 NOTE — Progress Notes (Signed)
Subjective: The patient says that he is having less cough and less chest congestion. He still continues to have some left-sided chest/side pain. He is wondering when he can go home.  Objective: Vital signs in last 24 hours: Filed Vitals:   02/02/12 0134 02/02/12 0519 02/02/12 0915 02/02/12 0929  BP: 162/75 148/77    Pulse: 81 91  66  Temp: 98 F (36.7 C) 98.1 F (36.7 C)    TempSrc: Oral Oral    Resp: 19 18  20   Height: 5\' 11"  (1.803 m)     Weight: 62.415 kg (137 lb 9.6 oz)     SpO2: 90% 90% 88% 88%    Intake/Output Summary (Last 24 hours) at 02/02/12 0957 Last data filed at 02/02/12 0547  Gross per 24 hour  Intake 764.59 ml  Output      0 ml  Net 764.59 ml    Weight change:   Physical exam: General: Pleasant alert 71 year old Caucasian man lying in bed, in no acute distress. Lungs: Occasional crackles in the bases, clear anteriorly. Heart: S1, S2, with no murmurs rubs or gallops. Abdomen: Positive bowel sounds, soft, nontender, nondistended. Extremities: No pedal edema. Neurologic: He is alert and oriented x2 including himself and place. Cranial nerves II through XII are grossly intact.  Lab Results: Basic Metabolic Panel:  Basename 02/02/12 0635 02/01/12 2020  NA 134* 131*  K 3.8 4.0  CL 99 92*  CO2 26 28  GLUCOSE 96 123*  BUN 18 18  CREATININE 0.98 1.08  CALCIUM 9.1 10.0  MG -- --  PHOS -- --   Liver Function Tests:  [facility] 02/02/12 0635 02/01/12 2020  AST 14 16  ALT 8 10  ALKPHOS 132* 131*  BILITOT 0.5 0.7  PROT 6.1 7.7  ALBUMIN 2.8* 3.6   No results found for this basename: LIPASE:2,AMYLASE:2 in the last 72 hours No results found for this basename: AMMONIA:2 in the last 72 hours CBC:  Basename 02/02/12 0635 02/01/12 2020  WBC 9.7 13.4*  NEUTROABS -- 10.1*  HGB 12.8* 14.7  HCT 37.2* 42.3  MCV 98.4 98.8  PLT 303 301   Cardiac Enzymes:  Basename 02/01/12 2020  CKTOTAL 59  CKMB 2.3  CKMBINDEX --  TROPONINI <0.30   BNP: No results  found for this basename: PROBNP:3 in the last 72 hours D-Dimer: No results found for this basename: DDIMER:2 in the last 72 hours CBG: No results found for this basename: GLUCAP:6 in the last 72 hours Hemoglobin A1C: No results found for this basename: HGBA1C in the last 72 hours Fasting Lipid Panel: No results found for this basename: CHOL,HDL,LDLCALC,TRIG,CHOLHDL,LDLDIRECT in the last 72 hours Thyroid Function Tests: No results found for this basename: TSH,T4TOTAL,FREET4,T3FREE,THYROIDAB in the last 72 hours Anemia Panel: No results found for this basename: VITAMINB12,FOLATE,FERRITIN,TIBC,IRON,RETICCTPCT in the last 72 hours Coagulation: No results found for this basename: LABPROT:2,INR:2 in the last 72 hours Urine Drug Screen: Drugs of Abuse  No results found for this basename: labopia, cocainscrnur, labbenz, amphetmu, thcu, labbarb    Alcohol Level: No results found for this basename: ETH:2 in the last 72 hours Urinalysis:  Basename 02/01/12 2052  COLORURINE YELLOW  LABSPEC 1.025  PHURINE 5.5  GLUCOSEU NEGATIVE  HGBUR TRACE*  BILIRUBINUR NEGATIVE  KETONESUR TRACE*  PROTEINUR TRACE*  UROBILINOGEN 0.2  NITRITE NEGATIVE  LEUKOCYTESUR NEGATIVE   Misc. Labs:   Micro: No results found for this or any previous visit (from the past 240 hour(s)).  Studies/Results: Dg Chest 2 View  02/01/2012  *RADIOLOGY REPORT*  Clinical Data: Cough.  Shortness of breath and dizziness.  Smoker.  CHEST - 2 VIEW  Comparison: None.  Findings: Underlying hyperinflation/COPD. Midline trachea.  Normal heart size and mediastinal contours for age.  No pleural effusion or pneumothorax.  Probable mild scarring at the apices.  Patchy airspace disease at the right lung base.  IMPRESSION:  1.  Patchy right lower lobe airspace disease, suspicious for infection.  Consider short-term radiographic follow-up. 2.  Underlying COPD/hyperinflation with probable scarring at the apices.  Original Report Authenticated  By: Consuello Bossier, M.D.   Ct Head Wo Contrast  02/01/2012  *RADIOLOGY REPORT*  Clinical Data: Confused and weakness.  History of melanoma.  CT HEAD WITHOUT CONTRAST  Technique:  Contiguous axial images were obtained from the base of the skull through the vertex without contrast.  Comparison: None.  Findings: Bone windows demonstrate clear paranasal sinuses and mastoid air cells.  Soft tissue windows demonstrate mildly age advanced cerebral atrophy.  Mild low density in the periventricular white matter likely related to small vessel disease.Remote lacunar infarct in the left caudate head and basil ganglia.  Remote lacunar infarct versus focal atrophy in the right cerebellar hemisphere on image 10.  No hemorrhage, hydrocephalus, acute infarct, intra-axial, or extra-axial fluid collections.  IMPRESSION:  1. No acute intracranial abnormality. 2. Cerebral atrophy and small vessel ischemic change. 3.  Remote lacunar infarct or infarcts.  Original Report Authenticated By: Consuello Bossier, M.D.    Medications: I have reviewed the patient's current medications.  Assessment: Principal Problem:  *CAP (community acquired pneumonia) Active Problems:  Hyponatremia  Generalized weakness  Memory loss, short term  Hypoxia   1. Community-acquired pneumonia. He is on Rocephin and azithromycin..  Borderline hypoxia. His oxygen saturations range from 88-90%. This could be attributable to pneumonia, although, because of his chronic tobacco abuse, he may have mildly chronic low oxygen saturations.  Tobacco abuse. The patient was advised to stop smoking. Tobacco replacement therapy has been started.  Hyponatremia, secondary to hypovolemia. Improving with IV fluid hydration.  Generalized weakness. This is likely secondary to pneumonia.  Short-term memory loss. CT of his head was negative. The CT does suggest history of prior strokes. Dementia workup has been started. MRI of the brain was ordered, however, I'm not  sure if this is going to be high yield given no significant cranial nerve abnormalities.  Plan:  1. Continue current management. We'll check the results of the laboratory studies to assess dementia. 2. We'll decrease the rate of IV fluids. 3. Tobacco cessation counseling. 4. Consider discharge over the next 24-48 hours.   LOS: 1 day   Corey Hughes 02/02/2012, 9:57 AM

## 2012-02-02 NOTE — Progress Notes (Signed)
MRI of brain was ordered as a stat. They would not be able to do the procedure until Monday. Doctor was notified and stated that getting the procedure Monday would be fine.

## 2012-02-02 NOTE — ED Notes (Signed)
Monitor NSR rave PVC rare PAC.

## 2012-02-03 ENCOUNTER — Encounter (HOSPITAL_COMMUNITY): Payer: Self-pay | Admitting: Internal Medicine

## 2012-02-03 DIAGNOSIS — Z720 Tobacco use: Secondary | ICD-10-CM | POA: Diagnosis present

## 2012-02-03 LAB — BASIC METABOLIC PANEL
CO2: 25 mEq/L (ref 19–32)
Calcium: 8.7 mg/dL (ref 8.4–10.5)
Creatinine, Ser: 0.84 mg/dL (ref 0.50–1.35)
Glucose, Bld: 100 mg/dL — ABNORMAL HIGH (ref 70–99)

## 2012-02-03 LAB — CBC
Hemoglobin: 11.9 g/dL — ABNORMAL LOW (ref 13.0–17.0)
MCH: 33.8 pg (ref 26.0–34.0)
MCHC: 34.5 g/dL (ref 30.0–36.0)
MCV: 98 fL (ref 78.0–100.0)
Platelets: 302 10*3/uL (ref 150–400)
RBC: 3.52 MIL/uL — ABNORMAL LOW (ref 4.22–5.81)

## 2012-02-03 MED ORDER — IPRATROPIUM-ALBUTEROL 18-103 MCG/ACT IN AERO: 2.0000 | INHALATION_SPRAY | RESPIRATORY_TRACT | Status: DC | PRN

## 2012-02-03 MED ORDER — CEFUROXIME AXETIL 500 MG PO TABS: 500.0000 mg | ORAL_TABLET | Freq: Two times a day (BID) | ORAL | Status: AC

## 2012-02-03 MED ORDER — AZITHROMYCIN 250 MG PO TABS: ORAL_TABLET | ORAL | Status: AC

## 2012-02-03 MED ORDER — ASPIRIN EC 81 MG PO TBEC: 81.0000 mg | DELAYED_RELEASE_TABLET | Freq: Every day | ORAL | Status: AC

## 2012-02-03 NOTE — Progress Notes (Signed)
Patient given discharge instructions and voiced understanding. Patient given prescriptions. Patient in stable condition and escorted by tech.

## 2012-02-03 NOTE — Discharge Summary (Signed)
Physician Discharge Summary  Corey Hughes MRN: [id number] DOB/AGE: 1941/06/01 71 y.o.  PCP: Ricci Barker., MD, MD   Admit date: 02/01/2012 Discharge date: 02/03/2012  Discharge Diagnoses:  1. Community-acquired pneumonia. 2. Borderline hypoxia, secondary to pneumonia and possible contribution from chronic tobacco abuse. 3. Generalized weakness secondary to pneumonia. 4. Normocytic anemia. 5. Hyponatremia. 6. Reported short-term memory impairment. 7. Old lacunar infarcts per CT scan of the brain.    Medication List  As of 02/03/2012 12:51 PM   STOP taking these medications         diphenhydramine-acetaminophen 25-500 MG Tabs         TAKE these medications         albuterol-ipratropium 18-103 MCG/ACT inhaler   Commonly known as: COMBIVENT   Inhale 2 puffs into the lungs every 4 (four) hours as needed for wheezing or shortness of breath.      aspirin EC 81 MG tablet   Take 1 tablet (81 mg total) by mouth daily.      azithromycin 250 MG tablet   Commonly known as: ZITHROMAX   Take 2 tablets starting tomorrow for one day and then 1 tablet daily thereafter until completed.      buPROPion 150 MG 12 hr tablet   Commonly known as: WELLBUTRIN SR   Take 150 mg by mouth 2 (two) times daily.      cefUROXime 500 MG tablet   Commonly known as: CEFTIN   Take 1 tablet (500 mg total) by mouth 2 (two) times daily.      gabapentin 300 MG capsule   Commonly known as: NEURONTIN   Take 300 mg by mouth 5 (five) times daily.      Tamsulosin HCl 0.4 MG Caps   Commonly known as: FLOMAX   Take 0.4 mg by mouth daily.            Discharge Condition: Improved and stable.  Disposition: Final discharge disposition not confirmed   Consults: None.   Significant Diagnostic Studies: Dg Chest 2 View  02/01/2012  *RADIOLOGY REPORT*  Clinical Data: Cough.  Shortness of breath and dizziness.  Smoker.  CHEST - 2 VIEW  Comparison: None.  Findings: Underlying hyperinflation/COPD.  Midline trachea.  Normal heart size and mediastinal contours for age.  No pleural effusion or pneumothorax.  Probable mild scarring at the apices.  Patchy airspace disease at the right lung base.  IMPRESSION:  1.  Patchy right lower lobe airspace disease, suspicious for infection.  Consider short-term radiographic follow-up. 2.  Underlying COPD/hyperinflation with probable scarring at the apices.  Original Report Authenticated By: Consuello Bossier, M.D.   Ct Head Wo Contrast  02/01/2012  *RADIOLOGY REPORT*  Clinical Data: Confused and weakness.  History of melanoma.  CT HEAD WITHOUT CONTRAST  Technique:  Contiguous axial images were obtained from the base of the skull through the vertex without contrast.  Comparison: None.  Findings: Bone windows demonstrate clear paranasal sinuses and mastoid air cells.  Soft tissue windows demonstrate mildly age advanced cerebral atrophy.  Mild low density in the periventricular white matter likely related to small vessel disease.Remote lacunar infarct in the left caudate head and basil ganglia.  Remote lacunar infarct versus focal atrophy in the right cerebellar hemisphere on image 10.  No hemorrhage, hydrocephalus, acute infarct, intra-axial, or extra-axial fluid collections.  IMPRESSION:  1. No acute intracranial abnormality. 2. Cerebral atrophy and small vessel ischemic change. 3.  Remote lacunar infarct or infarcts.  Original Report Authenticated By:  Consuello Bossier, M.D.     Microbiology: No results found for this or any previous visit (from the past 240 hour(s)).   Labs: Results for orders placed during the hospital encounter of 02/01/12 (from the past 48 hour(s))  CBC     Status: Abnormal   Collection Time   02/01/12  8:20 PM      Component Value Range Comment   WBC 13.4 (*) 4.0 - 10.5 (K/uL)    RBC 4.28  4.22 - 5.81 (MIL/uL)    Hemoglobin 14.7  13.0 - 17.0 (g/dL)    HCT 96.0  45.4 - 09.8 (%)    MCV 98.8  78.0 - 100.0 (fL)    MCH 34.3 (*) 26.0 - 34.0 (pg)     MCHC 34.8  30.0 - 36.0 (g/dL)    RDW 11.9 (*) 14.7 - 15.5 (%)    Platelets 301  150 - 400 (K/uL)   DIFFERENTIAL     Status: Abnormal   Collection Time   02/01/12  8:20 PM      Component Value Range Comment   Neutrophils Relative 75  43 - 77 (%)    Neutro Abs 10.1 (*) 1.7 - 7.7 (K/uL)    Band Neutrophils 0  0 - 10 (%)    Lymphocytes Relative 12  12 - 46 (%)    Lymphs Abs 1.7  0.7 - 4.0 (K/uL)    Monocytes Relative 12  3 - 12 (%)    Monocytes Absolute 1.6 (*) 0.1 - 1.0 (K/uL)    Eosinophils Relative 0  0 - 5 (%)    Eosinophils Absolute 0.0  0.0 - 0.7 (K/uL)    Basophils Relative 0  0 - 1 (%)    Basophils Absolute 0.0  0.0 - 0.1 (K/uL)    LUCs, % 0  0 - 4 (%)    LUC, Absolute 0  0.0 - 0.5 (K/uL)   COMPREHENSIVE METABOLIC PANEL     Status: Abnormal   Collection Time   02/01/12  8:20 PM      Component Value Range Comment   Sodium 131 (*) 135 - 145 (mEq/L)    Potassium 4.0  3.5 - 5.1 (mEq/L)    Chloride 92 (*) 96 - 112 (mEq/L)    CO2 28  19 - 32 (mEq/L)    Glucose, Bld 123 (*) 70 - 99 (mg/dL)    BUN 18  6 - 23 (mg/dL)    Creatinine, Ser 8.29  0.50 - 1.35 (mg/dL)    Calcium 56.2  8.4 - 10.5 (mg/dL)    Total Protein 7.7  6.0 - 8.3 (g/dL)    Albumin 3.6  3.5 - 5.2 (g/dL)    AST 16  0 - 37 (U/L)    ALT 10  0 - 53 (U/L)    Alkaline Phosphatase 131 (*) 39 - 117 (U/L)    Total Bilirubin 0.7  0.3 - 1.2 (mg/dL)    GFR calc non Af Amer 67 (*) >90 (mL/min)    GFR calc Af Amer 78 (*) >90 (mL/min)   CARDIAC PANEL(CRET KIN+CKTOT+MB+TROPI)     Status: Normal   Collection Time   02/01/12  8:20 PM      Component Value Range Comment   Total CK 59  7 - 232 (U/L)    CK, MB 2.3  0.3 - 4.0 (ng/mL)    Troponin I <0.30  <0.30 (ng/mL)    Relative Index RELATIVE INDEX IS INVALID  0.0 - 2.5  URINALYSIS, ROUTINE W REFLEX MICROSCOPIC     Status: Abnormal   Collection Time   02/01/12  8:52 PM      Component Value Range Comment   Color, Urine YELLOW  YELLOW     APPearance CLEAR  CLEAR     Specific  Gravity, Urine 1.025  1.005 - 1.030     pH 5.5  5.0 - 8.0     Glucose, UA NEGATIVE  NEGATIVE (mg/dL)    Hgb urine dipstick TRACE (*) NEGATIVE     Bilirubin Urine NEGATIVE  NEGATIVE     Ketones, ur TRACE (*) NEGATIVE (mg/dL)    Protein, ur TRACE (*) NEGATIVE (mg/dL)    Urobilinogen, UA 0.2  0.0 - 1.0 (mg/dL)    Nitrite NEGATIVE  NEGATIVE     Leukocytes, UA NEGATIVE  NEGATIVE    URINE MICROSCOPIC-ADD ON     Status: Abnormal   Collection Time   02/01/12  8:52 PM      Component Value Range Comment   Squamous Epithelial / LPF FEW (*) RARE     WBC, UA 0-2  <3 (WBC/hpf)    RBC / HPF 0-2  <3 (RBC/hpf)    Bacteria, UA RARE  RARE    COMPREHENSIVE METABOLIC PANEL     Status: Abnormal   Collection Time   02/02/12  6:35 AM      Component Value Range Comment   Sodium 134 (*) 135 - 145 (mEq/L)    Potassium 3.8  3.5 - 5.1 (mEq/L)    Chloride 99  96 - 112 (mEq/L)    CO2 26  19 - 32 (mEq/L)    Glucose, Bld 96  70 - 99 (mg/dL)    BUN 18  6 - 23 (mg/dL)    Creatinine, Ser 2.95  0.50 - 1.35 (mg/dL)    Calcium 9.1  8.4 - 10.5 (mg/dL)    Total Protein 6.1  6.0 - 8.3 (g/dL)    Albumin 2.8 (*) 3.5 - 5.2 (g/dL)    AST 14  0 - 37 (U/L)    ALT 8  0 - 53 (U/L)    Alkaline Phosphatase 132 (*) 39 - 117 (U/L)    Total Bilirubin 0.5  0.3 - 1.2 (mg/dL)    GFR calc non Af Amer 81 (*) >90 (mL/min)    GFR calc Af Amer >90  >90 (mL/min)   CBC     Status: Abnormal   Collection Time   02/02/12  6:35 AM      Component Value Range Comment   WBC 9.7  4.0 - 10.5 (K/uL)    RBC 3.78 (*) 4.22 - 5.81 (MIL/uL)    Hemoglobin 12.8 (*) 13.0 - 17.0 (g/dL)    HCT 28.4 (*) 13.2 - 52.0 (%)    MCV 98.4  78.0 - 100.0 (fL)    MCH 33.9  26.0 - 34.0 (pg)    MCHC 34.4  30.0 - 36.0 (g/dL)    RDW 44.0 (*) 10.2 - 15.5 (%)    Platelets 303  150 - 400 (K/uL)   VITAMIN B12     Status: Abnormal   Collection Time   02/02/12  6:35 AM      Component Value Range Comment   Vitamin B-12 1361 (*) 211 - 911 (pg/mL)   RPR     Status: Normal     Collection Time   02/02/12  6:35 AM      Component Value Range Comment   RPR NON REACTIVE  NON REACTIVE    TSH     Status: Normal   Collection Time   02/02/12  6:35 AM      Component Value Range Comment   TSH 3.131  0.350 - 4.500 (uIU/mL)   CBC     Status: Abnormal   Collection Time   02/03/12  6:25 AM      Component Value Range Comment   WBC 9.8  4.0 - 10.5 (K/uL)    RBC 3.52 (*) 4.22 - 5.81 (MIL/uL)    Hemoglobin 11.9 (*) 13.0 - 17.0 (g/dL)    HCT 65.7 (*) 84.6 - 52.0 (%)    MCV 98.0  78.0 - 100.0 (fL)    MCH 33.8  26.0 - 34.0 (pg)    MCHC 34.5  30.0 - 36.0 (g/dL)    RDW 96.2 (*) 95.2 - 15.5 (%)    Platelets 302  150 - 400 (K/uL)   BASIC METABOLIC PANEL     Status: Abnormal   Collection Time   02/03/12  6:25 AM      Component Value Range Comment   Sodium 130 (*) 135 - 145 (mEq/L)    Potassium 3.9  3.5 - 5.1 (mEq/L)    Chloride 98  96 - 112 (mEq/L)    CO2 25  19 - 32 (mEq/L)    Glucose, Bld 100 (*) 70 - 99 (mg/dL)    BUN 17  6 - 23 (mg/dL)    Creatinine, Ser 8.41  0.50 - 1.35 (mg/dL)    Calcium 8.7  8.4 - 10.5 (mg/dL)    GFR calc non Af Amer 86 (*) >90 (mL/min)    GFR calc Af Amer >90  >90 (mL/min)      HPI : The patient is a 71 year old man with a past medical history significant for BPH, melanoma, and a recent diagnosis of depression, who presented to the emergency department on 02/01/2012 with a chief complaint of weakness, cough, and memory problems. In the emergency department, he was afebrile and hemodynamically stable. His lab data were significant for a WBC of 13.4, sodium of 131, chest x-ray and that revealed patchy right lower lobe airspace disease and underlying COPD, and a CT scan of the head that revealed no acute intracranial abnormalities but with cerebral atrophy and remote lacunar infarcts. He was admitted for further evaluation and management.  HOSPITAL COURSE: The patient was started on treatment with ceftriaxone and azithromycin. Tobacco cessation  counseling was ordered. A nicotine patch was placed daily. He was strongly advised to stop smoking. Gentle IV fluids were started for hydration. An aspirin was started empirically for his history of lacunar infarcts. Of note, there was no acute intracranial findings on the CT of the head. Because it was reported by his wife that he had been having memory problems, an MRI of his brain was ordered. However over the weekend, an MRI of the brain could not be performed here at [facility]. Per my examination, the patient had no acute cranial nerve deficits. He was generally weak from pneumonia and hypovolemia/possible dehydration. Once his pneumonia was treated and he was hydrated, his generalized weakness resolved. He had been recently evaluated for dementia by his primary care physician. Apparently he passed the dementia screen. It was believed that an MRI of his brain would be low yield and would be deferred to a decision by his primary care physician rather than keeping him in the hospital for an additional 24 hours only for an MRI. His thyroid  function, vitamin B 12 level, and RPR were assessed. His TSH was within normal limits. His vitamin B 12 level was actually elevated. And, his RPR was nonreactive.   The patient's oxygen saturation ranged from 88-93%. It is likely that he has underlying chronic mild hypoxia from COPD and tobacco abuse. However, at the time of hospital discharge, he was oxygenating 93% on room air.  The patient also ambulated with the nursing staff. Although he was slightly short of breath, he did fairly well.  At the time of hospital discharge, the patient was hemodynamically stable. His white blood cell count normalized. He remained afebrile. His lungs were relatively clear. His hemoglobin did decrease some but this was thought to be secondary to hemodilution from IV fluids. His serum sodium improved to 134 but then decreased again to 130. He was well-hydrated and perhaps, he  may have had an element of SIADH, less likely volume overload. Therefore the IV fluids were discontinued. His serum sodium will need to be monitored in the outpatient setting. He received 2 days of intravenous IV antibiotics during the hospital course. He was discharged to home on 5 more days of Ceftin and azithromycin. He was also prescribed a Combivent inhaler. Once again, he was advised to stop smoking prior to discharge.  Discharge Exam:  Blood pressure 138/72, pulse 97, temperature 98.5 F (36.9 C), temperature source Oral, resp. rate 20, height 5\' 11"  (1.803 m), weight 62.415 kg (137 lb 9.6 oz), SpO2 93.00%.  Lungs: Decreased breath sounds in the bases, no audible wheezes or crackles or rhonchi. Heart: S1, S2, with no murmurs rubs or gallops. Abdomen: Positive bowel sounds, soft, nontender, nondistended. Extremities: No pedal edema. Neurologic: He is alert and oriented x3. Cranial nerves II through XII are intact. Strength is 5 over 5 throughout. Gait is intact.   Discharge Orders    Future Appointments: Provider: Department: Dept Phone: Center:   02/04/2012 7:00 AM [facility] 1 [facility] [phone] [place] [facility]     Future Orders Please Complete By Expires   Diet - low sodium heart healthy      Increase activity slowly      Discharge instructions      Comments:   AVOID SMOKING. USE NICOTINE REPLACEMENT TREATMENT IF NEEDED.      Follow-up Information    Follow up with CLINE,TODD W., MD .          Total discharge time: 40 minutes.  Signed: Pax Reasoner 02/03/2012, 12:51 PM

## 2012-02-04 ENCOUNTER — Other Ambulatory Visit (HOSPITAL_COMMUNITY): Payer: Medicare Other

## 2012-10-23 ENCOUNTER — Ambulatory Visit: Payer: Self-pay | Admitting: Gastroenterology

## 2013-01-28 ENCOUNTER — Inpatient Hospital Stay: Payer: Self-pay

## 2013-01-28 LAB — URINALYSIS, COMPLETE
Bilirubin,UR: NEGATIVE
Blood: NEGATIVE
Hyaline Cast: 3
Nitrite: NEGATIVE
RBC,UR: 2 /HPF (ref 0–5)

## 2013-01-28 LAB — COMPREHENSIVE METABOLIC PANEL
Albumin: 3.8 g/dL (ref 3.4–5.0)
Alkaline Phosphatase: 117 U/L (ref 50–136)
BUN: 23 mg/dL — ABNORMAL HIGH (ref 7–18)
Bilirubin,Total: 0.3 mg/dL (ref 0.2–1.0)
Chloride: 101 mmol/L (ref 98–107)
EGFR (African American): 47 — ABNORMAL LOW
EGFR (Non-African Amer.): 40 — ABNORMAL LOW
Potassium: 4.2 mmol/L (ref 3.5–5.1)
Total Protein: 7.3 g/dL (ref 6.4–8.2)

## 2013-01-28 LAB — CK: CK, Total: 70 U/L (ref 35–232)

## 2013-01-28 LAB — CBC
MCHC: 33.5 g/dL (ref 32.0–36.0)
Platelet: 228 10*3/uL (ref 150–440)
RBC: 3.91 10*6/uL — ABNORMAL LOW (ref 4.40–5.90)
RDW: 22.5 % — ABNORMAL HIGH (ref 11.5–14.5)
WBC: 6.7 10*3/uL (ref 3.8–10.6)

## 2013-01-28 LAB — TSH: Thyroid Stimulating Horm: 1.74 u[IU]/mL

## 2013-01-29 DIAGNOSIS — I6789 Other cerebrovascular disease: Secondary | ICD-10-CM

## 2013-01-29 LAB — BASIC METABOLIC PANEL
Anion Gap: 8 (ref 7–16)
BUN: 23 mg/dL — ABNORMAL HIGH (ref 7–18)
Calcium, Total: 8.2 mg/dL — ABNORMAL LOW (ref 8.5–10.1)
Chloride: 103 mmol/L (ref 98–107)
EGFR (Non-African Amer.): 58 — ABNORMAL LOW
Glucose: 97 mg/dL (ref 65–99)
Osmolality: 279 (ref 275–301)
Potassium: 3.7 mmol/L (ref 3.5–5.1)

## 2013-01-29 LAB — CBC WITH DIFFERENTIAL/PLATELET
Basophil %: 0.5 %
HCT: 37.6 % — ABNORMAL LOW (ref 40.0–52.0)
Lymphocyte %: 16.8 %
MCH: 34.5 pg — ABNORMAL HIGH (ref 26.0–34.0)
MCV: 103 fL — ABNORMAL HIGH (ref 80–100)
Monocyte #: 0.8 x10 3/mm (ref 0.2–1.0)
Monocyte %: 11.7 %
Neutrophil %: 70.9 %
Platelet: 217 10*3/uL (ref 150–440)
WBC: 6.7 10*3/uL (ref 3.8–10.6)

## 2013-01-30 LAB — HEMATOCRIT: HCT: 39.9 % — ABNORMAL LOW (ref 40.0–52.0)

## 2013-01-30 LAB — HEMOGLOBIN A1C: Hemoglobin A1C: 5.5 % (ref 4.2–6.3)

## 2013-01-30 LAB — URINE CULTURE

## 2013-01-30 LAB — BASIC METABOLIC PANEL
BUN: 20 mg/dL — ABNORMAL HIGH (ref 7–18)
Chloride: 100 mmol/L (ref 98–107)
EGFR (Non-African Amer.): >60
Glucose: 133 mg/dL — ABNORMAL HIGH (ref 65–99)

## 2013-01-31 LAB — BASIC METABOLIC PANEL
Anion Gap: 7 (ref 7–16)
BUN: 22 mg/dL — ABNORMAL HIGH (ref 7–18)
Chloride: 99 mmol/L (ref 98–107)
Co2: 31 mmol/L (ref 21–32)
Glucose: 90 mg/dL (ref 65–99)
Osmolality: 277 (ref 275–301)
Sodium: 137 mmol/L (ref 136–145)

## 2013-01-31 LAB — CBC WITH DIFFERENTIAL/PLATELET
Basophil #: 0 10*3/uL (ref 0.0–0.1)
Eosinophil #: 0 10*3/uL (ref 0.0–0.7)
Eosinophil %: 0.1 %
HGB: 12.8 g/dL — ABNORMAL LOW (ref 13.0–18.0)
Lymphocyte #: 1.7 10*3/uL (ref 1.0–3.6)
Lymphocyte %: 23 %
MCH: 34.2 pg — ABNORMAL HIGH (ref 26.0–34.0)
MCV: 103 fL — ABNORMAL HIGH (ref 80–100)
Monocyte %: 10.6 %
Neutrophil #: 5 10*3/uL (ref 1.4–6.5)
Neutrophil %: 66.1 %
RDW: 22 % — ABNORMAL HIGH (ref 11.5–14.5)
WBC: 7.5 10*3/uL (ref 3.8–10.6)

## 2013-02-01 LAB — EXPECTORATED SPUTUM ASSESSMENT W GRAM STAIN, RFLX TO RESP C

## 2013-02-02 LAB — PROTEIN ELECTROPHORESIS(ARMC)

## 2014-02-25 ENCOUNTER — Emergency Department: Payer: Self-pay | Admitting: Emergency Medicine

## 2014-02-25 LAB — URINALYSIS, COMPLETE
BILIRUBIN, UR: NEGATIVE
Bacteria: NONE SEEN
Blood: NEGATIVE
Glucose,UR: NEGATIVE mg/dL (ref 0–75)
LEUKOCYTE ESTERASE: NEGATIVE
Nitrite: NEGATIVE
PROTEIN: NEGATIVE
Ph: 5 (ref 4.5–8.0)
RBC,UR: 1 /HPF (ref 0–5)
Specific Gravity: 1.026 (ref 1.003–1.030)
Squamous Epithelial: NONE SEEN
WBC UR: NONE SEEN /HPF (ref 0–5)

## 2014-02-25 LAB — CBC WITH DIFFERENTIAL/PLATELET
BASOS ABS: 0.1 10*3/uL (ref 0.0–0.1)
BASOS PCT: 1.3 %
EOS ABS: 0 10*3/uL (ref 0.0–0.7)
Eosinophil %: 0.2 %
HCT: 41.2 % (ref 40.0–52.0)
HGB: 13.7 g/dL (ref 13.0–18.0)
Lymphocyte #: 0.8 10*3/uL — ABNORMAL LOW (ref 1.0–3.6)
Lymphocyte %: 13.6 %
MCH: 34.8 pg — AB (ref 26.0–34.0)
MCHC: 33.3 g/dL (ref 32.0–36.0)
MCV: 105 fL — ABNORMAL HIGH (ref 80–100)
MONO ABS: 0.4 x10 3/mm (ref 0.2–1.0)
Monocyte %: 7.2 %
NEUTROS PCT: 77.7 %
Neutrophil #: 4.6 10*3/uL (ref 1.4–6.5)
PLATELETS: 267 10*3/uL (ref 150–440)
RBC: 3.94 10*6/uL — AB (ref 4.40–5.90)
RDW: 19.8 % — ABNORMAL HIGH (ref 11.5–14.5)
WBC: 5.9 10*3/uL (ref 3.8–10.6)

## 2014-02-25 LAB — COMPREHENSIVE METABOLIC PANEL
ALBUMIN: 3.9 g/dL (ref 3.4–5.0)
ALK PHOS: 94 U/L
Anion Gap: 4 — ABNORMAL LOW (ref 7–16)
BILIRUBIN TOTAL: 0.5 mg/dL (ref 0.2–1.0)
BUN: 23 mg/dL — AB (ref 7–18)
CALCIUM: 9 mg/dL (ref 8.5–10.1)
CHLORIDE: 98 mmol/L (ref 98–107)
Co2: 31 mmol/L (ref 21–32)
Creatinine: 0.98 mg/dL (ref 0.60–1.30)
EGFR (Non-African Amer.): >60
Glucose: 113 mg/dL — ABNORMAL HIGH (ref 65–99)
Osmolality: 271 (ref 275–301)
Potassium: 3.7 mmol/L (ref 3.5–5.1)
SGOT(AST): 51 U/L — ABNORMAL HIGH (ref 15–37)
SGPT (ALT): 34 U/L (ref 12–78)
Sodium: 133 mmol/L — ABNORMAL LOW (ref 136–145)
TOTAL PROTEIN: 7.7 g/dL (ref 6.4–8.2)

## 2014-02-25 LAB — TROPONIN I: Troponin-I: <0.02 ng/mL

## 2014-02-25 LAB — LIPASE, BLOOD: Lipase: 1150 U/L — ABNORMAL HIGH (ref 73–393)

## 2014-03-11 ENCOUNTER — Observation Stay: Payer: Self-pay | Admitting: Internal Medicine

## 2014-03-11 LAB — COMPREHENSIVE METABOLIC PANEL
Albumin: 3.5 g/dL (ref 3.4–5.0)
Alkaline Phosphatase: 91 U/L
Anion Gap: 2 — ABNORMAL LOW (ref 7–16)
BILIRUBIN TOTAL: 0.5 mg/dL (ref 0.2–1.0)
BUN: 21 mg/dL — ABNORMAL HIGH (ref 7–18)
CALCIUM: 9 mg/dL (ref 8.5–10.1)
CHLORIDE: 100 mmol/L (ref 98–107)
CO2: 35 mmol/L — AB (ref 21–32)
CREATININE: 1.02 mg/dL (ref 0.60–1.30)
EGFR (African American): >60
Glucose: 88 mg/dL (ref 65–99)
Osmolality: 276 (ref 275–301)
Potassium: 3.7 mmol/L (ref 3.5–5.1)
SGOT(AST): 16 U/L (ref 15–37)
SGPT (ALT): 27 U/L (ref 12–78)
SODIUM: 137 mmol/L (ref 136–145)
Total Protein: 6.7 g/dL (ref 6.4–8.2)

## 2014-03-11 LAB — URINALYSIS, COMPLETE
BLOOD: NEGATIVE
Bacteria: NONE SEEN
Bilirubin,UR: NEGATIVE
Glucose,UR: NEGATIVE mg/dL (ref 0–75)
Ketone: NEGATIVE
LEUKOCYTE ESTERASE: NEGATIVE
Nitrite: NEGATIVE
Ph: 7 (ref 4.5–8.0)
Protein: NEGATIVE
RBC,UR: NONE SEEN /HPF (ref 0–5)
Specific Gravity: 1.003 (ref 1.003–1.030)
Squamous Epithelial: NONE SEEN
WBC UR: <1 /HPF (ref 0–5)

## 2014-03-11 LAB — CBC WITH DIFFERENTIAL/PLATELET
BASOS ABS: 0 10*3/uL (ref 0.0–0.1)
Basophil %: 0.5 %
EOS ABS: 0 10*3/uL (ref 0.0–0.7)
Eosinophil %: 0.3 %
HCT: 38.9 % — ABNORMAL LOW (ref 40.0–52.0)
HGB: 12.8 g/dL — ABNORMAL LOW (ref 13.0–18.0)
Lymphocyte #: 1.2 10*3/uL (ref 1.0–3.6)
Lymphocyte %: 15.6 %
MCH: 34.2 pg — AB (ref 26.0–34.0)
MCHC: 32.8 g/dL (ref 32.0–36.0)
MCV: 104 fL — ABNORMAL HIGH (ref 80–100)
MONOS PCT: 6.7 %
Monocyte #: 0.5 x10 3/mm (ref 0.2–1.0)
NEUTROS ABS: 5.9 10*3/uL (ref 1.4–6.5)
Neutrophil %: 76.9 %
Platelet: 253 10*3/uL (ref 150–440)
RBC: 3.73 10*6/uL — AB (ref 4.40–5.90)
RDW: 20.1 % — ABNORMAL HIGH (ref 11.5–14.5)
WBC: 7.6 10*3/uL (ref 3.8–10.6)

## 2014-03-11 LAB — TROPONIN I: Troponin-I: <0.02 ng/mL

## 2014-03-11 LAB — LIPASE, BLOOD: Lipase: 280 U/L (ref 73–393)

## 2014-03-11 LAB — AMYLASE: AMYLASE: 121 U/L — AB (ref 25–115)

## 2014-03-11 LAB — SEDIMENTATION RATE: ERYTHROCYTE SED RATE: 9 mm/h (ref 0–20)

## 2014-03-11 LAB — TSH: THYROID STIMULATING HORM: 1.8 u[IU]/mL

## 2014-03-12 ENCOUNTER — Ambulatory Visit: Payer: Self-pay | Admitting: Neurology

## 2014-03-12 LAB — BASIC METABOLIC PANEL
ANION GAP: 4 — AB (ref 7–16)
BUN: 13 mg/dL (ref 7–18)
CALCIUM: 8.6 mg/dL (ref 8.5–10.1)
Chloride: 104 mmol/L (ref 98–107)
Co2: 34 mmol/L — ABNORMAL HIGH (ref 21–32)
Creatinine: 1 mg/dL (ref 0.60–1.30)
EGFR (African American): >60
GLUCOSE: 82 mg/dL (ref 65–99)
OSMOLALITY: 282 (ref 275–301)
Potassium: 3.8 mmol/L (ref 3.5–5.1)
SODIUM: 142 mmol/L (ref 136–145)

## 2014-03-12 LAB — CBC WITH DIFFERENTIAL/PLATELET
BASOS ABS: 0 10*3/uL (ref 0.0–0.1)
Basophil %: 0.6 %
EOS ABS: 0.1 10*3/uL (ref 0.0–0.7)
EOS PCT: 1.4 %
HCT: 36.2 % — ABNORMAL LOW (ref 40.0–52.0)
HGB: 12 g/dL — AB (ref 13.0–18.0)
LYMPHS PCT: 37.1 %
Lymphocyte #: 1.9 10*3/uL (ref 1.0–3.6)
MCH: 34.5 pg — ABNORMAL HIGH (ref 26.0–34.0)
MCHC: 33.1 g/dL (ref 32.0–36.0)
MCV: 104 fL — AB (ref 80–100)
MONOS PCT: 7.3 %
Monocyte #: 0.4 x10 3/mm (ref 0.2–1.0)
NEUTROS ABS: 2.7 10*3/uL (ref 1.4–6.5)
Neutrophil %: 53.6 %
Platelet: 243 10*3/uL (ref 150–440)
RBC: 3.48 10*6/uL — ABNORMAL LOW (ref 4.40–5.90)
RDW: 20.6 % — AB (ref 11.5–14.5)
WBC: 5 10*3/uL (ref 3.8–10.6)

## 2014-03-13 LAB — URINE CULTURE

## 2014-03-14 LAB — COMPREHENSIVE METABOLIC PANEL
ALT: 14 U/L (ref 12–78)
AST: 15 U/L (ref 15–37)
Albumin: 3 g/dL — ABNORMAL LOW (ref 3.4–5.0)
Alkaline Phosphatase: 70 U/L
Anion Gap: 4 — ABNORMAL LOW (ref 7–16)
BUN: 16 mg/dL (ref 7–18)
Bilirubin,Total: 0.8 mg/dL (ref 0.2–1.0)
Calcium, Total: 8.8 mg/dL (ref 8.5–10.1)
Chloride: 102 mmol/L (ref 98–107)
Co2: 32 mmol/L (ref 21–32)
Creatinine: 1.11 mg/dL (ref 0.60–1.30)
EGFR (African American): >60
GLUCOSE: 81 mg/dL (ref 65–99)
OSMOLALITY: 276 (ref 275–301)
POTASSIUM: 4.4 mmol/L (ref 3.5–5.1)
Sodium: 138 mmol/L (ref 136–145)
Total Protein: 5.9 g/dL — ABNORMAL LOW (ref 6.4–8.2)

## 2014-03-14 LAB — AMMONIA: Ammonia, Plasma: 19 mcmol/L (ref 11–32)

## 2014-03-14 LAB — CBC WITH DIFFERENTIAL/PLATELET
BASOS ABS: 0.1 10*3/uL (ref 0.0–0.1)
Basophil %: 1 %
Eosinophil #: 0.1 10*3/uL (ref 0.0–0.7)
Eosinophil %: 2.5 %
HCT: 36.3 % — AB (ref 40.0–52.0)
HGB: 12.3 g/dL — ABNORMAL LOW (ref 13.0–18.0)
Lymphocyte #: 2.2 10*3/uL (ref 1.0–3.6)
Lymphocyte %: 36.6 %
MCH: 35.1 pg — ABNORMAL HIGH (ref 26.0–34.0)
MCHC: 33.9 g/dL (ref 32.0–36.0)
MCV: 103 fL — AB (ref 80–100)
Monocyte #: 0.5 x10 3/mm (ref 0.2–1.0)
Monocyte %: 8.2 %
Neutrophil #: 3.1 10*3/uL (ref 1.4–6.5)
Neutrophil %: 51.7 %
Platelet: 258 10*3/uL (ref 150–440)
RBC: 3.51 10*6/uL — ABNORMAL LOW (ref 4.40–5.90)
RDW: 19.6 % — ABNORMAL HIGH (ref 11.5–14.5)
WBC: 5.9 10*3/uL (ref 3.8–10.6)

## 2014-03-14 LAB — CLOSTRIDIUM DIFFICILE(ARMC)

## 2014-03-16 LAB — CULTURE, BLOOD (SINGLE)

## 2014-04-06 ENCOUNTER — Ambulatory Visit: Payer: Self-pay | Admitting: Internal Medicine

## 2014-04-06 LAB — CSF CELL COUNT WITH DIFFERENTIAL
CSF TUBE #: 3
EOS PCT: 0 %
Lymphocytes: 43 %
MONOCYTES/MACROPHAGES: 39 %
Neutrophils: 18 %
OTHER CELLS: 0 %
RBC (CSF): 82 /mm3
WBC (CSF): 2 /mm3

## 2014-04-06 LAB — PROTEIN, CSF: Protein, CSF: 46 mg/dL — ABNORMAL HIGH (ref 15–45)

## 2014-07-17 ENCOUNTER — Ambulatory Visit: Payer: Self-pay | Admitting: Internal Medicine

## 2014-07-29 ENCOUNTER — Observation Stay: Payer: Self-pay | Admitting: Internal Medicine

## 2014-07-29 LAB — BASIC METABOLIC PANEL
ANION GAP: 8 (ref 7–16)
BUN: 18 mg/dL (ref 7–18)
CHLORIDE: 101 mmol/L (ref 98–107)
CO2: 27 mmol/L (ref 21–32)
CREATININE: 1.32 mg/dL — AB (ref 0.60–1.30)
Calcium, Total: 9.3 mg/dL (ref 8.5–10.1)
EGFR (African American): >60
EGFR (Non-African Amer.): 53 — ABNORMAL LOW
Glucose: 129 mg/dL — ABNORMAL HIGH (ref 65–99)
Osmolality: 276 (ref 275–301)
POTASSIUM: 4.3 mmol/L (ref 3.5–5.1)
Sodium: 136 mmol/L (ref 136–145)

## 2014-07-29 LAB — HEPATIC FUNCTION PANEL A (ARMC)
ALK PHOS: 97 U/L
Albumin: 4.1 g/dL (ref 3.4–5.0)
BILIRUBIN TOTAL: 0.7 mg/dL (ref 0.2–1.0)
Bilirubin, Direct: 0.2 mg/dL (ref 0.00–0.20)
SGOT(AST): 25 U/L (ref 15–37)
SGPT (ALT): 34 U/L
Total Protein: 8.1 g/dL (ref 6.4–8.2)

## 2014-07-29 LAB — CBC WITH DIFFERENTIAL/PLATELET
BASOS ABS: 0.1 10*3/uL (ref 0.0–0.1)
BASOS PCT: 0.4 %
Eosinophil #: 0 10*3/uL (ref 0.0–0.7)
Eosinophil %: 0 %
HCT: 47.5 % (ref 40.0–52.0)
HGB: 15.1 g/dL (ref 13.0–18.0)
LYMPHS PCT: 6.6 %
Lymphocyte #: 0.8 10*3/uL — ABNORMAL LOW (ref 1.0–3.6)
MCH: 33.8 pg (ref 26.0–34.0)
MCHC: 31.9 g/dL — AB (ref 32.0–36.0)
MCV: 106 fL — AB (ref 80–100)
MONO ABS: 0.9 x10 3/mm (ref 0.2–1.0)
MONOS PCT: 7.1 %
NEUTROS ABS: 10.8 10*3/uL — AB (ref 1.4–6.5)
Neutrophil %: 85.9 %
PLATELETS: 298 10*3/uL (ref 150–440)
RBC: 4.47 10*6/uL (ref 4.40–5.90)
RDW: 19.3 % — ABNORMAL HIGH (ref 11.5–14.5)
WBC: 12.6 10*3/uL — ABNORMAL HIGH (ref 3.8–10.6)

## 2014-07-29 LAB — TROPONIN I: Troponin-I: <0.02 ng/mL

## 2014-07-29 LAB — TSH: THYROID STIMULATING HORM: 1.9 u[IU]/mL

## 2014-07-30 ENCOUNTER — Ambulatory Visit: Payer: Self-pay | Admitting: Neurology

## 2014-07-30 LAB — URINALYSIS, COMPLETE
BACTERIA: NONE SEEN
Bilirubin,UR: NEGATIVE
Glucose,UR: NEGATIVE mg/dL (ref 0–75)
Hyaline Cast: 3
Ketone: NEGATIVE
Leukocyte Esterase: NEGATIVE
NITRITE: NEGATIVE
PH: 5 (ref 4.5–8.0)
PROTEIN: NEGATIVE
Specific Gravity: 1.017 (ref 1.003–1.030)
Squamous Epithelial: 1
WBC UR: <1 /HPF (ref 0–5)

## 2014-07-31 LAB — CBC WITH DIFFERENTIAL/PLATELET
Basophil #: 0 10*3/uL (ref 0.0–0.1)
Basophil %: 0.4 %
EOS ABS: 0 10*3/uL (ref 0.0–0.7)
Eosinophil %: 0.1 %
HCT: 39.4 % — AB (ref 40.0–52.0)
HGB: 13.1 g/dL (ref 13.0–18.0)
LYMPHS ABS: 1 10*3/uL (ref 1.0–3.6)
Lymphocyte %: 12.1 %
MCH: 35.5 pg — ABNORMAL HIGH (ref 26.0–34.0)
MCHC: 33.4 g/dL (ref 32.0–36.0)
MCV: 106 fL — AB (ref 80–100)
MONO ABS: 0.8 x10 3/mm (ref 0.2–1.0)
MONOS PCT: 10.1 %
NEUTROS ABS: 6.4 10*3/uL (ref 1.4–6.5)
Neutrophil %: 77.3 %
Platelet: 233 10*3/uL (ref 150–440)
RBC: 3.7 10*6/uL — AB (ref 4.40–5.90)
RDW: 19.4 % — ABNORMAL HIGH (ref 11.5–14.5)
WBC: 8.3 10*3/uL (ref 3.8–10.6)

## 2014-07-31 LAB — BASIC METABOLIC PANEL
Anion Gap: 8 (ref 7–16)
BUN: 20 mg/dL — AB (ref 7–18)
CHLORIDE: 107 mmol/L (ref 98–107)
Calcium, Total: 8.3 mg/dL — ABNORMAL LOW (ref 8.5–10.1)
Co2: 26 mmol/L (ref 21–32)
Creatinine: 1.05 mg/dL (ref 0.60–1.30)
EGFR (Non-African Amer.): >60
GLUCOSE: 77 mg/dL (ref 65–99)
Osmolality: 283 (ref 275–301)
POTASSIUM: 3.9 mmol/L (ref 3.5–5.1)
Sodium: 141 mmol/L (ref 136–145)

## 2014-08-02 LAB — CBC WITH DIFFERENTIAL/PLATELET
BASOS ABS: 0 10*3/uL (ref 0.0–0.1)
BASOS PCT: 0.3 %
EOS ABS: 0 10*3/uL (ref 0.0–0.7)
EOS PCT: 0.1 %
HCT: 38.7 % — AB (ref 40.0–52.0)
HGB: 12.9 g/dL — ABNORMAL LOW (ref 13.0–18.0)
Lymphocyte #: 1.6 10*3/uL (ref 1.0–3.6)
Lymphocyte %: 23.3 %
MCH: 35.3 pg — ABNORMAL HIGH (ref 26.0–34.0)
MCHC: 33.4 g/dL (ref 32.0–36.0)
MCV: 106 fL — ABNORMAL HIGH (ref 80–100)
MONO ABS: 0.6 x10 3/mm (ref 0.2–1.0)
Monocyte %: 9.3 %
NEUTROS ABS: 4.5 10*3/uL (ref 1.4–6.5)
Neutrophil %: 67 %
PLATELETS: 279 10*3/uL (ref 150–440)
RBC: 3.66 10*6/uL — ABNORMAL LOW (ref 4.40–5.90)
RDW: 19.3 % — ABNORMAL HIGH (ref 11.5–14.5)
WBC: 6.8 10*3/uL (ref 3.8–10.6)

## 2014-08-02 LAB — BASIC METABOLIC PANEL
Anion Gap: 4 — ABNORMAL LOW (ref 7–16)
BUN: 24 mg/dL — ABNORMAL HIGH (ref 7–18)
CREATININE: 1.09 mg/dL (ref 0.60–1.30)
Calcium, Total: 8.8 mg/dL (ref 8.5–10.1)
Chloride: 102 mmol/L (ref 98–107)
Co2: 30 mmol/L (ref 21–32)
EGFR (African American): >60
EGFR (Non-African Amer.): >60
Glucose: 84 mg/dL (ref 65–99)
Osmolality: 275 (ref 275–301)
Potassium: 4.1 mmol/L (ref 3.5–5.1)
Sodium: 136 mmol/L (ref 136–145)

## 2014-08-17 ENCOUNTER — Ambulatory Visit: Payer: Self-pay | Admitting: Internal Medicine

## 2015-04-08 NOTE — Consult Note (Signed)
PATIENT NAME:  Corey Hughes, Corey Hughes MR#:  [id number] DATE OF BIRTH:  [birth year]  DATE OF CONSULTATION:  01/30/2013  REFERRING PHYSICIAN:  Theodoro Grist, MD CONSULTING PHYSICIAN:  Corey Skains, MD  REASON FOR CONSULTATION: Peripheral vascular disease, transient ischemic attack, stroke and hypertension with tachycardia needing further treatment options.   CHIEF COMPLAINT: "I've been out of sorts".  HISTORY OF PRESENT ILLNESS: This is a 74 year old male with borderline hypertension and hyperlipidemia with previous history of stroke and TIA at this time with some issues concerning for lethargy as well as unsteadiness. He did have evaluation showing some peripheral vascular disease and a previous stroke. He has had improvements at this time and has had no further strokelike symptoms. The patient has been on appropriate medications and currently there is no evidence of acute myocardial infarction. EKG has shown sinus tachycardia, otherwise normal EKG.   REVIEW OF SYSTEMS: The remainder includes no vision change, ringing in the ears, hearing loss, cough, congestion, heartburn, nausea, vomiting, diarrhea, bloody stools, stomach pain, extremity pain, leg weakness, cramping of the buttocks, known blood clots, headaches, blackouts, dizzy spells, nosebleeds, congestion, trouble swallowing, frequent urination, urination at night, muscle weakness, numbness, anxiety, depression, skin lesions or skin rashes.   PAST MEDICAL HISTORY: 1. Hypertension.  2. Tachycardia.  3. Peripheral vascular disease.   FAMILY HISTORY: No family members with early onset of cardiovascular disease.   SOCIAL HISTORY: Smokes 1-1/2 packs per day and occasionally drinks alcohol.   ALLERGIES AND MEDICATIONS: As listed.   PHYSICAL EXAMINATION: VITAL SIGNS: Blood pressure is 136/68 bilaterally and heart rate 72 upright, reclining and regular.  GENERAL: He is a well appearing male in no acute distress.  HEENT: No icterus,  thyromegaly, ulcers, hemorrhage or xanthelasma.  CARDIOVASCULAR: Regular rate and rhythm. Normal S1 and S2 without murmur, gallop or rub. PMI is normal in size and placement. Carotid upstroke normal without bruit. Jugular venous pressure normal.  RESPIRATORY:  Lungs are clear to auscultation with normal respirations.  EXTREMITIES: 2+ bilateral pulses in dorsal, pedal, radial and femoral arteries without lower extremity edema, cyanosis, clubbing or ulcers.  NEUROLOGIC: He is oriented to time, place and person with normal mood and affect.   ASSESSMENT: A 74 year old male with heavy tobacco abuse, stroke, peripheral vascular disease and hypertension with sinus tachycardia, but no current evidence of atrial fibrillation and no current evidence of myocardial infarction requiring further treatment options.   RECOMMENDATIONS: 1. Continue hypertension control and tachycardia control with beta blocker if able to reduce cardiovascular risk.  2. Aspirin for further risk reduction of stroke and/or transient ischemic attack.  3. Echocardiogram for left ventricular systolic dysfunction and other causes of transient ischemic attack.  4. Carotid Doppler to assess for transient ischemic attack.  5. The patient was instructed for discontinuation of tobacco abuse, which is significant. 6. Further treatment options after ambulation and follow for improvements.  ____________________________ Corey Skains, MD [email or web address] D: 01/30/2013 10:19:07 ET T: 01/30/2013 11:18:48 ET JOB#: [id number]  cc: Corey Skains, MD, <Dictator> Corey Skains MD ELECTRONICALLY SIGNED 02/13/2013 8:54

## 2015-04-08 NOTE — H&P (Signed)
PATIENT NAME:  Corey Hughes, Corey Hughes MR#:  [id number] DATE OF BIRTH:  1941-04-08  DATE OF ADMISSION:  01/28/2013  PRIMARY CARE PHYSICIAN: Tama High III, MD   CHIEF COMPLAINT: Increased weakness of the bilateral lower extremities, left more than right, for over 3 weeks and some confusion today as well. Unable to get up and walk.  HISTORY OF PRESENT ILLNESS: The patient is a very nice 74 year old gentleman who has a history of chronic back pain, COPD. The patient comes with a history of 3 weeks of getting really weak, generally weak, but it seems like it has been affecting more his left lower extremity. The patient has been unbalanced, and for 3 weeks his wife has been telling him to come to the hospital and he has refused up until today. She decided to call the EMS because the patient was not getting any better, actually had significant worsening of his lower extremity weakness. The patient was not able to stand up. He fell by backwards several times trying to get up off of chair. The wife noted that the patient is dragging more his left lower extremity. He also has had some changes of his bowels and urination where he has accidents occasionally, but not all of the time. He says that he sometimes makes it to the bathroom, at others he is not able, but overall it seems like the history is more consistent with accidents due to lack of time to go into the bathroom rather than full incontinence. The patient has been ataxic, moving to the left than the right when he walks, and he has been doing very small steps when he walks.   The patient has a history of COPD and today he was having increased shortness of breath with oxygen saturation around 92, although right now he is on room air and he is not having any signs of COPD exacerbation. No wheezing or shortness of breath.   The patient also has noticed some incoordination with the arms, and the wife states that he has a lot of trouble getting dressed. He has been  treated in the pain management clinic for multiple different pains, back pain, shoulder pain, lower extremity pain. At this moment, he is not on any heavy medications, mostly relaxants like Robaxin.   The patient has not been eating well. His dementia seems to be increasing, and the family is concerned about the whole picture. I was able to speak with the patient, with the granddaughter and the wife, and they all corroborate the history.   REVIEW OF SYSTEMS:  CONSTITUTIONAL: The patient denies any fever or fatigue. Positive weakness. Positive pain. Denies any weight loss.  EYES: No blurry vision. No double vision. No inflammation of the eyes.  ENT: No difficulty swallowing. No nasal secretions. No epistaxis.  RESPIRATORY: Occasional cough, which is chronic. He uses inhalers. No wheezing. No hemoptysis. No significant dyspnea right now, but he occasionally has flare-ups. No peripheral respirations.  CARDIOVASCULAR: No chest pain, no orthopnea, no palpitations, no syncope.  GASTROINTESTINAL: No nausea, vomiting, abdominal pain. No hematemesis. No rectal bleeding.  GENITOURINARY: No dysuria, hematuria, frequency or incontinence. No prostatitis. He still has not been able to give [place] a urine sample and he has occasional incontinence as I mentioned above.  ENDOCRINE: No polyuria, polydipsia or polyphagia. No cold or heat intolerance.  The patient is very tachycardic though. HEMATOLOGIC AND LYMPHATIC: No anemia, easy bruising or bleeding. No swollen glands.  SKIN: Without any rashes or petechiae.  MUSCULOSKELETAL: No significant gout. Positive pain in neck, back, shoulder, hip, knees, pretty much all of his joints.  NEUROLOGIC: No previous CVAs or TIAs. The patient had altered mental status today, was really confused. He does have dementia with memory loss, and he had more weakness earlier on left side but now it is only his leg apparently. That piece of information is not clear.  PSYCHIATRIC: No insomnia.  No nervousness. No agitation.  PAST MEDICAL HISTORY:  1.  Dementia.  2.  COPD.  3.  Back pain.  4.  Shoulder pain. 5.  Bilateral knee pain.  6.  BPH. 7.  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   ALLERGIES: No known drug allergies.   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   FAMILY HISTORY: No MI, no CAD, no diabetes as far as he can tell me, and the family corroborates.   SOCIAL HISTORY: Positive for smoking tobacco, 1 pack a day since he was a teenager. Alcohol: Denies any alcohol use right now. He lives with his wife and his son and he used to work for a cigarette factory.   MEDICATIONS:  1.  Symbicort. 2.  Gabapentin.  3.  Donepezil. 4.  Flomax.   PHYSICAL EXAMINATION:  VITAL SIGNS: Blood pressure has been around 118/72, pulse up to 128, respirations 18, temperature 98.6.  GENERAL: The patient is alert. He is oriented in person and place, but not in time. He is cooperative. He looks moderately dehydrated, but he is not in any acute distress. He looks very debilitated.  HEENT: Pupils are equal and reactive. Extraocular movements are intact. Mucosae are dry. No oropharyngeal exudates. No oral lesions.  NECK: Supple. No JVD. No thyromegaly. No adenopathy. No carotid bruits. No rigidity.  CARDIOVASCULAR: Regular rate and rhythm. No murmurs, rubs or gallops are appreciated. The patient is tachycardic and he has occasional extra beats, but overall is regular. No displacement of PMI. No tenderness to palpation of anterior chest wall.  LUNGS: Overall clear with no wheezing or rales. There are rhonchi when he coughs, but overall they are clear. No use of accessory muscles. No dullness to percussion.  ABDOMEN: Soft, nontender, nondistended. No hepatosplenomegaly. No masses. Bowel sounds are positive.  EXTREMITIES: No edema, no cyanosis, no clubbing.  GENITAL: Deferred. SKIN: Decreased turgor. No rashes or petechiae.   NEUROLOGIC: Cranial nerves II through XII are intact. Strength of upper extremities is equal, 5/5. Strength of lower extremities to me at this moment is equal, 4/5, but it seems like the patient is not able to stand up. He falls back. Romberg is unable to be performed. The patient has some decrease of sharpness in his movements. There is no significant tremor. There is no dysmetria that I can tell at this moment. It is just not quite sharp on his movements. His balance is very bad. He falls back on his back posteriorly. No lateralization that I can tell. He is not able to walk, as I mentioned above. His DTRs are +1. His sensation is normal in both lower extremities. Subjectively, he thinks that his left side is less than the right, but objectively they both seem equal, but they are definitely decreased in strength.  PSYCHIATRIC: Negative for anxiety or agitation.  LYMPHATIC: Negative for lymphadenopathy.  MUSCULOSKELETAL: No significant joint deformities.  LABORATORY AND RADIOLOGIC DATA: CT scan of the head does not show abnormalities. His glucose is 136. His BUN is 23. His creatinine is  1.67. I do not have a baseline creatinine. Sodium is 132. His GFR is around 40. His LFTs are normal. His first set of troponins and CK are normal. His white count is 6.7, hemoglobin 13. He has elevated MCV and MCH. Chest x-ray: No significant infiltrates, mostly changes that could be related to COPD.   ASSESSMENT AND PLAN: A 74 year old gentleman with history of chronic obstructive pulmonary disease, dementia, back pain, benign prostatic hypertrophy, comes with history of worsening weakness, mostly of the lower extremities.  1.  Discussed the case with Emergency Room physician. She wanted him to be evaluated for stroke. At this moment, I am not quite certain that this is a stroke, but it could be the beginning of it. Could be also benign prostatic hypertrophy. The MRI will confirm any of the 2 possibly. If continues and  there are no signs of normal pressure hydrocephalus, will refer to neurology. At this moment, we have to look out for other etiologies of the weakness and confusion today. Likely it was related to his dehydration and also, he is in acute kidney injury, looked very dehydrated. We are going to give him IV fluids. His electrolytes seem overall okay except for low sodium, which could be also an indicator of his dehydration. The patient is very tachycardic as well.  2.  Lower extremity weakness, more on the left side than right: The patient has significant history of back pain which is chronic. He is a patient of the pain clinic. I am going to get a CT scan of the thoracic and lower spine just to evaluate him for spine compression, as this could feed history of the lower extremity weakness.  3.  Tachycardia: Will get a TSH and give some fluids. If the patient does not have an arrhythmia, just sinus tachycardia with premature atrial contractions, it could be related with his chronic obstructive pulmonary disease.  4.  Dementia: Continue treatment with donepezil.  5.  Chronic obstructive pulmonary disease: Continue treatment with Symbicort.  6.  Tobacco abuse: The patient needs to stop smoking. Counseling was given for about 5 minutes. The patient states that yes he knows he needs to quit smoking.  7.  Deep vein thrombosis prophylaxis: Will start heparin tomorrow morning as we complete the workup for stroke.  8.  The patient is full code.  9.  Elevated MCV, MCH: Get vitamin levels.  10.  Acute kidney injury: Fluid challenge with isotonic saline solution.  11.  Hyperglycemia: Monitor glucose. Check hemoglobin A1c if the next glucose is high.   TIME SPENT: I spent about 45 minutes with this admission. We are going to sign off this patient to Dr. Ramonita Lab for the morning.   ____________________________ Gowanda Sink, MD [email or web address] D: 01/28/2013 83:09:40 ET T: 01/28/2013 21:33:57  ET JOB#: [id number]  cc:  Sink, MD, <Dictator> Adin Hector, MD Cristi Loron MD ELECTRONICALLY SIGNED 02/04/2013 14:22

## 2015-04-08 NOTE — Consult Note (Signed)
Referring Physician:  James Ivanoff, Roselie Awkward :   Primary Care Physician:  [facility], [address], [place], [place] [address], [place] [phone]  Reason for Consult: Admit Date: 28-Jan-2013  Chief Complaint: left sided weakness.  Reason for Consult: left sided weakness   History of Present Illness: History of Present Illness:   HISTORY OF PRESENT ILLNESS: year old man with a history of COPD and chronic low back pain presents with a few weeks of generalized weakness and focal left lower extremity weakness in particular.  The patient cannot relate his history due to his moderate level of dementia and his generally reticent nature.  As such, the bulk of the history is gathered from prior notes and speaking with care providers.  It seems that he has had worsening ambulation fora  few weeks now.  His wife eventually got fed up and called EMS since his LLE weakness seemed to be getting worse she thought.  There is also a possibility of new incontinence per her report.  Nothing clearly caused onset of these problems.  Also the patient had been having difficulty dressing himself due to concurrent arm weakness.  When present, the symptoms have been severe due to causing imbalance and risk for falls.  The patient reports to me that since admission his left sided strength has significantly improved.  It improved spontaneously, no clear treatment or intervention that improved the strength.  He has a history significant for dementia and extensive back and and extremity.   MEDICAL HISTORY:  Dementia.  COPD.  Back pain.  Shoulder pain. Bilateral knee pain.  BPH. 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  HISTORY:  No MI, no CAD, no diabetes as far as he can tell me, and the family corroborates.  HISTORY:  Positive for smoking tobacco, 1 pack a day since he was a teenager. Alcohol: Denies any  alcohol use right now. He lives with his wife and his son and he used to work for a cigarette factory.   Symbicort. Gabapentin.  Donepezil. Flomax.   ALLERGIES: No known drug allergies.   EXAM   NAD.  Normocephalic and atraumatic. exam shows normal disc size, appearance and C/D ratio without clear evidence of papilledema. and S2 sounds are within normal limits, without murmurs, gallops, or rubs. - Normal- NormalDrift - Absent bilaterally.    Shoulder abduction (deltoid/supraspinatus, axillary/suprascapular n, C5)   Elbow flexion (biceps brachii, musculoskeletal n, C5-6)   Elbow extension (triceps, radial n, C7)   Finger adduction (interossei, ulnar n, T1) -    Hip flexion (iliopsoas, L1/L2)-    Knee flexion (hamstrings, sciatic n, L5/S1)-    Knee extension (quadriceps, femoral n, L3/4)   Ankle dorsiflexion (tibialis anterior, deep fibular n, L4/5)   Ankle plantarflexion (gastroc, tibial n, S1) STATUS: Patient is oriented to self.  Unsure of president, year, or month.  Follows commands quickly and correctly.  Recent memory is moderately decreased.  Remote memory is mildly decreased.  Attention span and concentration are both slightly decreased.  Naming, repetition, and comprehension are within normal limits.  He generates very little speech which raises the possibility of an expressive aphasia vs advancing dementia related paucity of speech.  Patient's fund of knowledge appears decreased for educational level. NERVES:   CN II (normal visual acuity and visual fields)   CN III, IV, VI (extraocular muscles are intact)   CN V (  facial sensation is intact bilaterally)   CN VII (facial strength is intact bilaterally, no facial droop)   CN VIII (hearing is intact bilaterally)   CN IX/X (palate elevates midline, normal phonation)   CN XI (shoulder shrug strength is normal and symmetric)   CN XII (tongue protrudes midline) to pain and temp bilaterally (spinothalamic tracts)to position and vibration bilaterally  (dorsal columns)    Biceps   Brachioradialis    Patellar   Achilles to nose testing is within normal limits.  74 year old man with a history of COPD and chronic low back pain presents with a few weeks of generalized weakness and focal left lower extremity weakness in particular.   focal weakness reported initially, the concern was for stroke.  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  These deficits would not explain his left sided weakness as the R cerebellum fibers typically supply the right limbs.  I have personally viewed his MRI and discussed this result with him.  I have reviewed his tests and labs and discussed pertinent results with him including remarkable EKG, normal TSH and initially with ARF.  His worsening overall function could have been attributed to his dehydrated status and ARF.  On my exam, his LUE and LLE strength are very close to if not the same as his right sided strength especially when accounting for the fact that he is right handed.  If he begins to have new left sided weakness that I did not appreciate today or it begins to fluctuate, then I would consider a carotid ultrasound to rule out cartoid stenosis for possible recurrent TIA.  In addition, in that event I would consider an EEG given his dementia and brain atrophy with history of focal neurologic deficits without stroke, this could represent Todds paralysis.  Alternatively, his history of back pain with possible radiculopathy vs spinal stenosis could be limiting his ambulation and causing perceived LUE and LLE weakness when he is actually being limited due to pain.  If this problem returns, then would consider an MRI of the C spine to eval for spinal stenosis given that he had been complaining of LUE and LLE symptoms this would only localize to the cervical spine.  Pain related volitional limb limitation seems possible since his ability to describe his problems is  very limited by his paucity of speech and dementia.  He should work with PT regularly for gait and balance training.    Corey Nakayama, MD  ROS:  General denies complaints   HEENT no complaints   Lungs no complaints   Cardiac no complaints   GI no complaints   GU no complaints   Musculoskeletal no complaints   Extremities no complaints   Skin no complaints   Endocrine no complaints   Past Medical/Surgical Hx:  Cancer, Skin:   Rheumatoid Arthritis:   Colon Polypectomy:   Home Medications: Medication Instructions Last Modified Date/Time  Flomax 0.4 mg oral capsule 1 cap(s) orally once a day (at bedtime) 12-Feb-14 21:28  donepezil 5 mg oral tablet 1 tab(s) orally once a day (at bedtime) 12-Feb-14 21:28  Symbicort 80 mcg-4.5 mcg/inh inhalation aerosol 2 puff(s) inhaled 2 times a day 12-Feb-14 21:28  Neurontin 300 mg oral capsule 1 cap(s) orally up to 5 times a day 12-Feb-14 21:28   [facility] Current Meds:  Acetaminophen * tablet, ( Tylenol (325 mg) tablet)  650 mg Oral q4h PRN for pain or  temp. greater than 100.4  - Indication: Pain/Fever  Docusate Sodium capsule, ( Colace)  100 mg Oral bid PRN for constipation  - Indication: Stool Softener  Ondansetron injection, ( Zofran injection )  4 mg, IV push, q4h PRN for Nausea/Vomiting  Indication: Nausea/ Vomiting  Pantoprazole tablet, 40 mg Oral q6am  - Indication: Erosive Esophagitis/ GERD  Instructions:  DO NOT CRUSH  Senna tablet, ( Senokot)  1 tablet(s) Oral bid PRN for constipation  - Indication: Stool Softner/ Constipation/ Bowel Prep for Surgery  Instructions:  1 tablet = 8.6 mg  Gabapentin  capsule, ( Neurontin)  300 mg Oral tid  - Indication: Seizures/ Mood Stabilizer  DULoxetine capsule, ( Cymbalta)  30 mg Oral q48h  - Indication: Depression  Aspirin Enteric Coated tablet, ( Ecotrin)  81 mg Oral daily  - Indication: Pain/Fever/Thromboembolic Disorders/Post MI/Prophylaxis MI  Instructions:  Initiate Bleeding  Precautions Protocol--DO NOT CRUSH  HePARin injection, 5000 unit(s), Subcutaneous, q12h  Indication: Anticoagulant, Monitor Anticoags per hospital protocol  Magnesium Oxide tablet, ( Mag-Ox)  400 mg Oral bid  - Indication: Prevention of Magnesium Deficiency  Nicotine Oral Inhaler Kit, ( Nicotrol Oral inhaler kit )  1 cartridge Inhalation q1h PRN for nicotine craving  - Indication: smoking cessation  Instructions:  Use usually limited to 6-16 cartridges in 24 hours.  Puff on cartridge for about 20 minutes.  Dustin Folks Code: Black with pkg]  Nicotine Patch, ( Habitrol patch )  14 mg Transdermal daily  -Indication:Smoking Cessation  Instructions:  [Waste Code: Black with pkg]  Tiotropium Handihaler 5'S, ( SPiriVA Handihaler )  1 capsule(s) Inhalation daily  Capsules are for use in inhaler - not for oral use.  Budesonide-Formoterol 160/4.5 mcg HFAA Inhaler,  ( Symbicort 160/4.5 inhaler )  2 puff(s) Inhalation bid  -Indication:asthma  Instructions:  Dustin Folks Code: SendToRx]  Inhaler must be reprimed if it has been dropped or not used in 7 days or more.  Shake well for 5 seconds before each use.  PredniSONE tablet, ( Deltasone)  50 mg Oral daily  - Indication: Inflammation  Lisinopril tablet, ( Zestril)  20 mg Oral daily  - Indication: Hypertension/ CHF  meTOProlol tartrate tablet,  ( Lopressor)  25 mg Oral bid  - Indication: Antihypertensive/ Angina  Cefuroxime Axetil tablet,  ( Ceftin)  500 mg Oral q12h  - Indication: Infection  Azithromycin tablet, 250 mg Oral daily  Stop After: 4 Days  - Indication: Infection  Instructions:  x 4 days  Allergies:  No Known Allergies:   Vital Signs: **Vital Signs.:   14-Feb-14 19:45  Vital Signs Type Routine  Temperature Temperature (F) 97.9  Celsius 36.6  Pulse Pulse 79  Respirations Respirations 18  Systolic BP Systolic BP 785  Diastolic BP (mmHg) Diastolic BP (mmHg) 69  Mean BP 96  Pulse Ox % Pulse Ox % 94  Pulse Ox Activity Level   At rest  Oxygen Delivery 2L   Lab Results: Thyroid:  12-Feb-14 21:24   Thyroid Stimulating Hormone 1.74 (0.45-4.50 (International Unit)  ----------------------- Pregnant patients have  different reference  ranges for TSH:  - - - - - - - - - -  Pregnant, first trimetser:  0.36 - 2.50 uIU/mL)  Hepatic:  12-Feb-14 17:13   Bilirubin, Total 0.3  Alkaline Phosphatase 117  SGPT (ALT) 22  SGOT (AST) 27  Total Protein, Serum 7.3  Albumin, Serum 3.8  Routine Micro:  12-Feb-14 22:44   Micro Text Report URINE CULTURE   COMMENT  NO GROWTH IN 36 HOURS   ANTIBIOTIC                       Specimen Source INDWELLING CATHETER  Culture Comment NO GROWTH IN 36 HOURS  Result(s) reported on 30 Jan 2013 at 08:39AM.  General Ref:  12-Feb-14 21:24   Vitamin B12, Serum ========== TEST NAME ==========  ========= RESULTS =========  = REFERENCE RANGE =  VITAMIN B12  Vitamin B12 Vitamin B12                     [facility]  1519 pg/mL           ]           [id number]               [facility]            No: [id number]           [address], [place], [place] [address]           Lindon Romp, MD         [phone]   Result(s) reported on 31 Jan 2013 at 06:18AM.  Vitamin D, 1,25 and 25-Hydroxy, Serum ========== TEST NAME ==========  ========= RESULTS =========  = REFERENCE RANGE =  VIT D, 1,25 & 25-HYDROXY  Vitamin D, 1,25 + 25-Hydroxy Calcitriol(1,25 di-OH Vit D)    [   Result Pending pg/mL ]         10.0-75.0 Specimen has been received and initial testing has been performed. However, additional testing is required to verify initial results. Final result to follow.               [facility]            No: [id number]           [address], [place], [place] [address]        Lindon Romp, MD         [phone]   Result(s) reported on 30 Jan 2013 at 09:18PM.  Cardiology:  12-Feb-14 17:08   Ventricular Rate 114  Atrial Rate 114  P-R Interval  116  QRS Duration 80  QT 320  QTc 441  P Axis 76  R Axis 70  T Axis 61  ECG interpretation Sinus tachycardia with Premature supraventricular complexes Nonspecific ST abnormality Abnormal ECG No previous ECGs available ----------unconfirmed---------- Confirmed by OVERREAD, NOT ([id number]), editor PEARSON, BARBARA ([id number]) on 01/31/2013 9:00:52 AM    19:56   Ventricular Rate 115  Atrial Rate 115  P-R Interval 122  QRS Duration 80  QT 358  QTc 495  P Axis 82  R Axis 70  T Axis 79  ECG interpretation Sinus tachycardia with Premature atrial complexes Nonspecific ST abnormality Abnormal ECG When compared with ECG of 27-Jan-2013 23:31, Premature atrial complexes are now Present Nonspecific T wave abnormality no longer evident in Lateral leads ----------unconfirmed---------- Confirmed by OVERREAD, NOT ([id number]), editor PEARSON, BARBARA ([id number]) on 01/31/2013 9:01:30 AM  13-Feb-14 09:24   Echo Doppler  Interpretation Summary   No source of CVA noted. Otherwise essentially a normal study. Left  ventricular systolic function is normal. Ejection Fraction = >55%.  The transmitral spectral Doppler flow pattern is suggestive of  impaired LV relaxation. The right ventricular systolic function is  normal. Right ventricular systolic pressure is normal.   PatientHeight: 180 cm   PatientWeight: 57 kg   BSA: 1.7 m2  Procedure:   Most of the acoustic windows were suboptimal, but the best imaging  was obtained from the subcostal window.   A two-dimensional transthoracic echocardiogram with color flow and  Doppler was performed.  Left Ventricle   Ejection Fraction = >55%.   The transmitral spectral Doppler flow pattern is suggestive of  impaired LV relaxation.   The left ventricular wall motion is normal.   The left ventricle is normal in size.   There is normal left ventricular wall thickness.   Left ventricular systolic function is normal.  Right Ventricle   The right ventricle is normal size.    The right ventricular systolic function is normal.  Atria   The left atrial size is normal.   Right atrial size is normal.   There is no Doppler evidence for an atrial septal defect.  Mitral Valve   The mitral valve leaflets appear normal. There is no evidence of  stenosis, fluttering, or prolapse.   There is no mitral regurgitation noted.   There is no mitral valve stenosis.  Tricuspid Valve   The tricuspid valve is normal.   There is mild tricuspid regurgitation.   Right ventricular systolic pressure is normal.  Aortic Valve   The aortic valve opens well.   No aortic regurgitation is present.   No hemodynamically significant valvular aortic stenosis.  Pulmonic Valve   The pulmonic valve leaflets are thin and pliable; valve motion is  normal.   Trace pulmonic valvular regurgitation.  Great Vessels   The aortic root is normal size.   The pulmonary artery is normal size.  Pericardium/Pleural   There is no pleural effusion.   No pericardial effusion.  MMode 2D Measurements and Calculations   RVDd: 3.3 cm   IVSd: 0.72 cm   LVIDd: 4.8 cm   LVIDs: 3.4 cm   LVPWd: 0.84 cm   FS: 29 %   EF(Teich): 56 %   Ao root diam: 3.3 cm   LA dimension: 2.9 cm   LVOT diam: 2.1 cm  Doppler Measurements and Calculations   MV E point: 57 cm/sec   MV A point: 89 cm/sec   MV E/A: 0.64    MV dec time: 0.26 sec   Ao V2 max: 107 cm/sec   Ao max PG: 5.0 mmHg   AVA(V,D): 2.3 cm2   LV max PG: 2.0 mmHg   LV V1 max: 72 cm/sec   PA V2 max: 115 cm/sec   PA max PG: 5.0 mmHg   TR Max vel: 256 cm/sec   TR Max PG: 26 mmHg   RVSP: 36 mmHg   RAP systole: 10 mmHg  Reading Physician: Ida Rogue  Sonographer: Sherrie Sport Interpreting Physician:  Ida Rogue,  electronically signed on  01-30-2013 12:12:07 Requesting Physician: Ida Rogue    17:56   Ventricular Rate 95  Atrial Rate 95  P-R Interval 98  QRS Duration 92  QT 364  QTc 457  P Axis 66  R Axis 67  T Axis 69   ECG interpretation Sinus rhythm with marked sinus arrhythmia with short PR Nonspecific ST abnormality Abnormal ECG When compared with ECG of 28-Jan-2013 19:56, No significant change was found Confirmed by Humphrey Rolls, SHAUKAT ([id number]) on 01/30/2013 8:52:23 AM  Overreader: Neoma Laming  Routine Chem:  12-Feb-14 17:13   Glucose, Serum  136  BUN  23  Creatinine (comp)  1.67  Sodium, Serum  132  Potassium, Serum 4.2  Chloride, Serum 101  CO2, Serum 26  Calcium (Total),  Serum 8.6  Anion Gap  5  Osmolality (calc) 270  eGFR (African American)  47  eGFR (Non-African American)  40 (eGFR values <7m/min/1.73 m2 may be an indication of chronic kidney disease (CKD). Calculated eGFR is useful in patients with stable renal function. The eGFR calculation will not be reliable in acutely ill patients when serum creatinine is changing rapidly. It is not useful in  patients on dialysis. The eGFR calculation may not be applicable to patients at the low and high extremes of body sizes, pregnant women, and vegetarians.)    214:48  Folic Acid, Serum 118.5(Result(s) reported on 28 Jan 2013 at 10:08PM.)  13-Feb-14 01:17   Glucose, Serum 97  BUN  23  Creatinine (comp) 1.24  Sodium, Serum 138  Potassium, Serum 3.7  Chloride, Serum 103  CO2, Serum 27  Calcium (Total), Serum  8.2  Anion Gap 8  Osmolality (calc) 279  eGFR (African American) >60  eGFR (Non-African American)  58 (eGFR values <663mmin/1.73 m2 may be an indication of chronic kidney disease (CKD). Calculated eGFR is useful in patients with stable renal function. The eGFR calculation will not be reliable in acutely ill patients when serum creatinine is changing rapidly. It is not useful in  patients on dialysis. The eGFR calculation may not be applicable to patients at the low and high extremes of body sizes, pregnant women, and vegetarians.)  Hemoglobin A1c ([facility]) 5.5 (The American Diabetes Association recommends that a primary goal  of therapy should be <7% and that physicians should reevaluate the treatment regimen in patients with HbA1c values consistently >8%.)  Magnesium, Serum  1.7 (1.8-2.4 THERAPEUTIC RANGE: 4-7 mg/dL TOXIC: > 10 mg/dL  -----------------------)  14-Feb-14 10:32   Glucose, Serum  133  BUN  20  Creatinine (comp) 0.93  Sodium, Serum  135  Potassium, Serum 4.0  Chloride, Serum 100  CO2, Serum 31  Calcium (Total), Serum 8.8  Anion Gap  4  Osmolality (calc) 275  eGFR (African American) >60  eGFR (Non-African American) >60 (eGFR values <6071min/1.73 m2 may be an indication of chronic kidney disease (CKD). Calculated eGFR is useful in patients with stable renal function. The eGFR calculation will not be reliable in acutely ill patients when serum creatinine is changing rapidly. It is not useful in  patients on dialysis. The eGFR calculation may not be applicable to patients at the low and high extremes of body sizes, pregnant women, and vegetarians.)  15-Feb-14 05:18   Glucose, Serum 90  BUN  22  Creatinine (comp) 0.93  Sodium, Serum 137  Potassium, Serum 3.9  Chloride, Serum 99  CO2, Serum 31  Calcium (Total), Serum 8.9  Anion Gap 7  Osmolality (calc) 277  eGFR (African American) >60  eGFR (Non-African American) >60 (eGFR values <7m43mn/1.73 m2 may be an indication of chronic kidney disease (CKD). Calculated eGFR is useful in patients with stable renal function. The eGFR calculation will not be reliable in acutely ill patients when serum creatinine is changing rapidly. It is not useful in  patients on dialysis. The eGFR calculation may not be applicable to patients at the low and high extremes of body sizes, pregnant women, and vegetarians.)  Cardiac:  12-Feb-14 17:13   Troponin I < 0.02 (0.00-0.05 0.05 ng/mL or less: NEGATIVE  Repeat testing in 3-6 hrs  if clinically indicated. >0.05 ng/mL: POTENTIAL  MYOCARDIAL INJURY. Repeat  testing in 3-6 hrs if  clinically  indicated. NOTE: An increase or decrease  of 30% or more  on serial  testing suggests a  clinically important change)  CK, Total 70 (Result(s) reported on 28 Jan 2013 at 05:54PM.)  13-Feb-14 01:17   Troponin I < 0.02 (0.00-0.05 0.05 ng/mL or less: NEGATIVE  Repeat testing in 3-6 hrs  if clinically indicated. >0.05 ng/mL: POTENTIAL  MYOCARDIAL INJURY. Repeat  testing in 3-6 hrs if  clinically indicated. NOTE: An increase or decrease  of 30% or more on serial  testing suggests a  clinically important change)    09:10   Troponin I < 0.02 (0.00-0.05 0.05 ng/mL or less: NEGATIVE  Repeat testing in 3-6 hrs  if clinically indicated. >0.05 ng/mL: POTENTIAL  MYOCARDIAL INJURY. Repeat  testing in 3-6 hrs if  clinically indicated. NOTE: An increase or decrease  of 30% or more on serial  testing suggests a  clinically important change)  Routine UA:  12-Feb-14 22:44   Color (UA) Amber  Clarity (UA) Hazy  Glucose (UA) Negative  Bilirubin (UA) Negative  Ketones (UA) Trace  Specific Gravity (UA) 1.025  Blood (UA) Negative  pH (UA) 5.0  Protein (UA) 100 mg/dL  Nitrite (UA) Negative  Leukocyte Esterase (UA) Negative (Result(s) reported on 28 Jan 2013 at 11:26PM.)  RBC (UA) 2 /HPF  WBC (UA) <1 /HPF  Bacteria (UA) TRACE  Epithelial Cells (UA) NONE SEEN  Mucous (UA) PRESENT  Hyaline Cast (UA) 3 /LPF (Result(s) reported on 28 Jan 2013 at 11:26PM.)  Routine Hem:  12-Feb-14 17:13   WBC (CBC) 6.7  RBC (CBC)  3.91  Hemoglobin (CBC) 13.7  Hematocrit (CBC) 40.8  Platelet Count (CBC) 228 (Result(s) reported on 28 Jan 2013 at 05:44PM.)  MCV  104  MCH  34.9  MCHC 33.5  RDW  22.5  13-Feb-14 01:17   WBC (CBC) 6.7  RBC (CBC)  3.64  Hemoglobin (CBC)  12.6  Hematocrit (CBC)  37.6  Platelet Count (CBC) 217  MCV  103  MCH  34.5  MCHC 33.4  RDW  22.5  Neutrophil % 70.9  Lymphocyte % 16.8  Monocyte % 11.7  Eosinophil % 0.1  Basophil % 0.5  Neutrophil # 4.8  Lymphocyte # 1.1   Monocyte # 0.8  Eosinophil # 0.0  Basophil # 0.0 (Result(s) reported on 29 Jan 2013 at 01:36AM.)  14-Feb-14 10:32   Hematocrit (CBC)  39.9 (Result(s) reported on 30 Jan 2013 at 10:54AM.)  15-Feb-14 05:18   WBC (CBC) 7.5  RBC (CBC)  3.75  Hemoglobin (CBC)  12.8  Hematocrit (CBC)  38.8  Platelet Count (CBC) 258  MCV  103  MCH  34.2  MCHC 33.1  RDW  22.0  Neutrophil % 66.1  Lymphocyte % 23.0  Monocyte % 10.6  Eosinophil % 0.1  Basophil % 0.2  Neutrophil # 5.0  Lymphocyte # 1.7  Monocyte # 0.8  Eosinophil # 0.0  Basophil # 0.0 (Result(s) reported on 31 Jan 2013 at [facility].)   Radiology Results: MRI:    13-Feb-14 10:33, MRI Brain Without Contrast  MRI Brain Without Contrast   REASON FOR EXAM:    left side weakness  COMMENTS:       PROCEDURE: MR  - MR BRAIN WO CONTRAST  - Jan 29 2013 10:33AM     RESULT: History: Weakness.    Findings: Multiplanar, multisequence images brain is obtained.   Diffusion-weighted images reveal no evidence of acute ischemia. White   matter changes consistent with chronic ischemia. Old lacunar infarcts.    IMPRESSION:  Chronic white matter ischemic change. Old lacunar  infarcts.   No acute abnormality.      Verified By: Osa Craver, M.D., MD  CT:    12-Feb-14 17:50, CT Head Without Contrast  CT Head Without Contrast   REASON FOR EXAM:    ams, altered gait  COMMENTS:       PROCEDURE: CT  - CT HEAD WITHOUT CONTRAST  - Jan 28 2013  5:50PM     RESULT: Comparison:  None    Technique: Multiple axial images from the foramen magnum to the vertex   were obtained without IVcontrast.    Findings:    There is no evidence for mass effect, midline shift, or extra-axial fluid   collections. There is no evidence for space-occupying lesion,   intracranial hemorrhage, or cortical-based area of infarction.   Periventricular andsubcortical hypoattenuation is consistent with     chronic small vessel ischemic disease. There is an old lacunar  infarct in   the left basal ganglia. Old lacunar infarct is demonstrated in the right   cerebellum.    The osseous structures are unremarkable.    IMPRESSION:    1. No acute intracranial process.  2. Chronic small vessel ischemic disease.      Dictation Site: 8        Verified By: Gregor Hams, M.D., MD   Electronic Signatures: Anabel Bene (MD)  (Signed 15-Feb-14 10:59)  Authored: REFERRING PHYSICIAN, Primary Care Physician, Consult, History of Present Illness, Review of Systems, PAST MEDICAL/SURGICAL HISTORY, HOME MEDICATIONS, Current Medications, ALLERGIES, NURSING VITAL SIGNS, LAB RESULTS, RADIOLOGY RESULTS   Last Updated: 15-Feb-14 10:59 by Anabel Bene (MD)

## 2015-04-08 NOTE — Discharge Summary (Signed)
PATIENT NAME:  Corey Hughes, Corey Hughes MR#:  [id number] DATE OF BIRTH:  Dec 23, 1940  DATE OF ADMISSION:  01/28/2013 DATE OF DISCHARGE:  02/01/2013  DISCHARGE DIAGNOSES:   1.  Lower extremity weakness.  2.  Spinal stenosis.  3.  History of cerebrovascular accident.  4.  Chronic respiratory failure due to chronic obstructive pulmonary disease.  5.  Possible pneumonia.  6.  Tobacco dependence.   DISCHARGE MEDICATIONS:   1.  Symbicort 2 puffs b.i.d.  2.  Donepezil 5 mg daily.  3.  Flomax 0.4 mg daily.  4.  Neurontin 300 mg t.i.d.  5.  Aspirin 81 mg daily.  6.  Metoprolol 25 mg b.i.d.  7.  Cefuroxime 1 tab q. 12 hours for 2 days.  8.  Azithromycin 250 mg daily for 2 days.   HISTORY AND PHYSICAL:  This is a 74 year old male with a history of COPD and chronic back pain who presented with 3 weeks of general weakness affecting the lower extremities. He also noted increased shortness of breath for the last several days. His wife was concerned about his memory and forgetfulness. An initial head CT showed no acute intracranial process. A chest x-ray showed no acute cardiopulmonary disease with an old finding of a right-sided pulmonary nodule. The patient was admitted with concerns of CVA.   HOSPITAL COURSE:  Physical therapy was consulted. Aspirin was started. Due to his shortness of breath, he was also placed on antibiotics and steroids. Blood pressure was mildly elevated and he was initiated on lisinopril and metoprolol. For tobacco dependence, he was put on a nicotine patch. On his second hospital day, neurology was consulted and MRI brain was obtained. MRI showed chronic white matter ischemic changes with old lacunar infarcts. Dr. Melrose Nakayama of neurology assessed the patient and agreed there were no brain MRI findings to explain his weakness. MRI of the lumbar spine was obtained which did show multilevel degenerative disc disease as well as neural foraminal narrowing at L4-L5 and concern for possible  insufficiency fracture in the left hemisacrum due to finding of bone marrow edema at that site. Physical therapy assessment was for outpatient physical therapy. The patient was discharged on February 16th with plan for outpatient therapy. Due to low oxygen saturations on ambulation, he was also discharged with oxygen by nasal cannula. Antibiotics will be completed for a total of a 5-day course.   DISCHARGE INSTRUCTIONS:   1.  Follow up with Dr. Caryl Comes in 1 to 2 weeks.  2.  Follow up with Dr. Melrose Nakayama, neurology, in 2 to 3 weeks.  3.  He was advised on the importance of smoking cessation.    ____________________________ A. Lavone Orn, MD ams:si D: 02/01/2013 11:45:40 ET T: 02/01/2013 23:30:47 ET JOB#: [id number]  cc: A. Lavone Orn, MD, <Dictator> Adin Hector, MD [facility] Melrose Nakayama, MD  Sherlon Handing MD ELECTRONICALLY SIGNED 02/08/2013 19:41

## 2015-04-09 NOTE — H&P (Signed)
PATIENT NAME:  Corey Hughes, PAVER MR#:  [id number] DATE OF BIRTH:  1941-12-02  DATE OF ADMISSION:  07/29/2014    PRIMARY CARE PHYSICIAN: [facility].   CHIEF COMPLAINT: Weakness.   HISTORY OF PRESENT ILLNESS: A 74 year old Caucasian gentleman with a history of Parkinson's, dementia, COPD, on 2 liters of nasal cannula, dependent rheumatoid arthritis on no medications, presenting with weakness.  Wife at bedside describes gradual onset of weakness, worsening over the last 3-4 day duration with associated poor p.o. intake; however, no nausea, vomiting, diarrhea, abdominal pain. He actually suffered a fall today from description, a mechanical fall, secondary to weakness. No head trauma. No loss of consciousness; however, this fall prompted presentation to the Emergency Department. The patient denies any further symptomatology, though question the reliability of his answers.  The wife also states no further symptoms.  REVIEW OF SYSTEMS:  Question the reliability of these statements, as the patient is demented, but with a combination of him and his wife.  CONSTITUTIONAL: No fever. Positive for fatigue, weakness.  EYES: No blurred vision, double vision or eye pain.  EARS, NOSE, THROAT: Denies tinnitus, ear pain, hearing loss.  RESPIRATORY: No cough, wheeze, shortness of breath.  CARDIOVASCULAR: No chest pain, palpitations, or edema.  GASTROINTESTINAL: No nausea, vomiting, diarrhea, abdominal pain.  GENITOURINARY: No dysuria, hematuria.  ENDOCRINE: No nocturia or thyroid problems.  HEMATOLOGIC/LYMPHATICS:  No bruising, bleeding.  SKIN: No rashes or lesions.  MUSCULOSKELETAL: No pain in neck, back, shoulder, knees, hips or arthritic symptoms.  NEUROLOGIC: No paralysis, paresthesias.  PSYCHIATRIC: No anxiety or depressive symptoms. Otherwise, full review of systems performed negative.   PAST MEDICAL HISTORY:  Parkinson's dementia, COPD, chronic respiratory failure on 2 liters nasal cannula at  baseline, rheumatoid arthritis.   SOCIAL HISTORY: Occasional tobacco use, as well as occasional alcohol use.   FAMILY HISTORY: No cardiovascular or pulmonary disorders.   ALLERGIES: No known drug allergies.   HOME MEDICATIONS: Tramadol 50 mg p.o. 3 times daily as needed for pain, Flomax 0.4 mg 2 capsules p.o. daily, gabapentin 300 mg p.o. 3 times daily, Lipitor 10 mg p.o. at bedtime, metoprolol 25 mg p.o. b.i.d.   PHYSICAL EXAMINATION: VITAL SIGNS: Temperature 97.9, heart rate 115, respirations 20, blood pressure 138/88, saturating 92%, 2 liters nasal cannula. Weight 63.5 kg, BMI 19.5.  GENERAL: Somewhat disheveled Caucasian gentleman who appears in no acute distress.  HEAD: Normocephalic, atraumatic.  EYES: Pupils equal, round, and reactive to light with some conjunctivitis noted of the right eye.  Extraocular muscles intact. No scleral icterus.  MOUTH: Dry mucosal membrane. Dentition intact. No abscess noted.  EARS, NOSE THROAT:  Clear without exudates. No external lesions.  NECK: Supple. No thyromegaly. No nodules. No JVD.  PULMONARY: Clear to auscultation bilaterally without wheezes, rubs or rhonchi. No use of accessory muscles. Good respiratory effort.  CHEST: Nontender to palpation. Does have a barrel chest shape.  CARDIOVASCULAR: S1, S2, regular rate and rhythm. No murmurs, rubs, or gallops. No edema. Pedal pulses 2+ bilaterally.  GASTROINTESTINAL: Soft, nontender, nondistended. No masses. Positive bowel sounds. No hepatosplenomegaly.  MUSCULOSKELETAL: No swelling, clubbing, or edema. Range of motion full in all extremities.  NEUROLOGIC: Cranial nerves II through XII intact and seems to have decreased sensation of the feet. Reflexes intact.  SKIN: No ulceration, lesions, rash, cyanosis. Skin warm, dry. Turgor intact.  PSYCHIATRIC: Mood and affect blunted. He is awake, alert, oriented to person, as well as place; however, he is unable to answer what the date is, actually any  portion  including day of the week,  month or year.  Insight and judgment appear to be poor.   RADIOLOGY DATA:  Chest x-ray performed, no acute cardiopulmonary process. CT head performed revealing old left basal ganglia infarct. No acute intracranial process.   REMAINDER OF LABORATORY DATA: Sodium 136, potassium 4.3, chloride 101, bicarbonate 27, BUN 18, creatinine 1.32, glucose 129. LFTs within normal limits. WBC 12.6, hemoglobin 15.1, platelets of 298,000.   EKG performed reveals normal sinus rhythm with left ventricular hypertrophy and this was  biatrial enlargement.   ASSESSMENT AND PLAN: A 74 year old Caucasian gentleman with a history of Parkinson's dementia and chronic obstructive pulmonary disease, presenting with weakness.  1.  Acute kidney injury, likely prerenal in nature given history of poor p.o. intake. Provide IV fluid hydration, normal saline. Follow the urine output and renal function.  2.  Weakness.  Will check B12, as long he does have history of B12 deficiency and he is not on any medications.  Also check a vitamin D level, given frequent falls and check a urinalysis now, as he does have a mild leukocytosis and that could potentially explain some of his symptomatology. Will also get physical therapy to evaluate the patient.  3.  Hypertension. Continue with Lopressor. 4.  Hyperlipidemia.  Continue statin therapy.  5.  Deep venous thrombosis prophylaxis with heparin subcutaneous.   CODE STATUS: The patient is full code.   TIME SPENT: 45 minutes    ____________________________ Aaron Mose. Travis Mastel, MD dkh:DT D: 07/29/2014 21:40:30 ET T: 07/29/2014 22:22:32 ET JOB#: [id number]  cc: Aaron Mose. Amair Shrout, MD, <Dictator> Kerrick Miler Woodfin Ganja MD ELECTRONICALLY SIGNED 07/30/2014 23:04

## 2015-04-09 NOTE — H&P (Signed)
PATIENT NAME:  Corey Hughes, Corey Hughes MR#:  [id number] DATE OF BIRTH:  02-18-1941  DATE OF ADMISSION:  03/11/2014  CHIEF COMPLAINT: Weakness, abdominal pain, vomiting.   HISTORY OF PRESENT ILLNESS: A 74 year old male with history of COPD with chronic respiratory failure, remote history of melanoma, history of prior stroke, as well as parkinsonism, comes after a two-week history of abdominal discomfort with vomiting. He was evaluated in the  Emergency Room on 03/12, found to have a lipase over 1100 at that time; CT abdomen and pelvis without source of the above. The abdominal discomfort is better, but still present. Appetite has been poor. He has continued to have very loose stool, though not any more frequent, about 3 times per day. His wife states that he has become more confused, and today he is brought in in a wheelchair as he is feeling very unsteady when he stands. When I stood him at the site of the bed, he was walking with hips and knees flexed, having to hold on, which is very different from his baseline. He is admitted now with altered mental status, diarrhea and what appear to be dehydration, with symptoms persistent over the last 2 weeks.   PAST MEDICAL HISTORY: 1.  Chronic abdominal pain with extensive prior workup.  2.  History of peptic ulcer disease.  3. COPD with chronic respiratory failure, home 2 liters nasal cannula.  4.  History of melanoma removal in 2004.  5.  B12 deficiency.  6.  Diverticulosis.  7.  Prior lacunar infarcts by CT scan at [facility], February 2013.   ALLERGIES: No known drug allergies.   MEDICATIONS: 1.  Lipitor 10 mg at bedtime.  2.  Sinemet 25/100, 1 p.o. t.i.d.  3.  Tramadol 50 mg p.o. t.i.d. p.r.n. pain.  4.  Cyanocobalamin 250 mg p.o. daily.  5.  Neurontin 300 mg p.o. b.i.d.  6.  Symbicort 80/4.5, 2 puffs b.i.d.  7.  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  8.  Aspirin 81 mg daily.  9.  Lopressor 25 mg p.o. b.i.d.  10.  Flomax 0.4 mg 2  tablets p.o. at bedtime.   SOCIAL HISTORY: Quarter pack smoking per day. Occasional alcohol.   FAMILY HISTORY: Alcoholism.   REVIEW OF SYSTEMS:  Please see HPI. Has felt chilled, but no fevers. A poor appetite, as noted above. No real chest pain or palpitations. Some shortness of breath without cough. Still occasionally dark, but no bright red blood per rectum or true melena described. Some transient swelling, not seen today. The remainder of complete review of systems is negative.   PHYSICAL EXAMINATION: VITAL SIGNS: Weight 140, blood pressure 140/70, temperature 97.7, pulse 75.  GENERAL: Thin, elderly male, appears ill.  EYES: Pupils round and reactive to light. Lids and conjunctivae are unremarkable.  EARS, NOSE AND THROAT: External examination unremarkable. Oropharynx is slightly dry without lesions.  NECK: Arthritic changes with the trachea in the midline. No thyromegaly.  CARDIOVASCULAR: Regular rate and rhythm without murmurs, gallops or rubs. Carotid and radial pulses are 2+.  LUNGS: Decreased air flow without wheeze or retraction. Saturation 93% on 5 liters nasal cannula.  ABDOMEN: Soft, slight distention, quiet bowel sounds. Minimal epigastric tenderness without guard, rebound.  SKIN: Decreased skin turgor. No rashes.  LYMPH NODES: No cervical or supraclavicular nodes.  MUSCULOSKELETAL: No clubbing, cyanosis or significant edema. Left third and fourth toes are absent.  NEUROLOGIC: Cranial nerves intact except for hearing. Decreased facial expression. Ambulates with hips and knees  flexed. No pronator drift.   IMPRESSION AND PLAN: 1. Altered mental status/dehydration/abdominal pain. Admit and place on IV fluids. Recheck lipase, check amylase,met c, CBC, urinalysis, urine cultures and blood cultures for the sources of the above. Consider abdominal films pending on results of the above. C. diff toxin to be sent. Contact isolation until this is back.  2.  Altered mental status. This may  simply be metabolic encephalopathy in the face of a  patient who has got some dementia, but has worsening ambulation, mental status as well, is worrisome. We will get CT of the head, get neurology consultation for any evidence of NPH. This may simply be parkinsonism progressing as well. We will continue him on Sinemet.  3.  History of cerebrovascular accident. We will continue aspirin for now provided CT is negative. Hold Lipitor until we see what his liver enzymes look like.  4.  B12 deficiency. We will check B12 level and treat if necessary.   Further treatment to be based on progress with the above.     ____________________________ Adin Hector, MD bjk:dmm D: 03/11/2014 09:54:50 ET T: 03/11/2014 10:21:09 ET JOB#: [id number]  cc: Adin Hector, MD, <Dictator> Ramonita Lab MD ELECTRONICALLY SIGNED 03/15/2014 8:03

## 2015-04-09 NOTE — Discharge Summary (Signed)
PATIENT NAME:  Corey Hughes, Corey Hughes MR#:  [id number] DATE OF BIRTH:  [birth year]  DATE OF ADMISSION:  03/11/2014 DATE OF DISCHARGE:  03/15/2014  FINAL DIAGNOSES: 1. Dehydration.  2. Viral gastroenteritis likely as cause of number one.  3. 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  4. Degenerative disk disease.  5. Chronic obstructive pulmonary disease with chronic respiratory failure.  6. Hypertension.   HISTORY AND PHYSICAL: Please see dictated admission history and physical.   HOSPITAL COURSE: The patient was admitted with nausea, vomiting, diarrhea at home, generalized weakness, worsening mental status and falls. He responded rapidly to gentle hydration here. He had no further diarrhea. He originally was placed on contact precautions as we waited to check C. difficile toxin, and C. diff toxin was negative.   His mental status did improve, however, he remains confused, and he has had some alterations in behavior at home; neurology saw the patient, and they felt most likely that he has some underlying frontotemporal dementia and they discussed this at length with the patient's family members. Because of this and his ataxia, however, and because his ventricles did look big on CT scan, question of normal pressure hydrocephalus has been raised. He was not able to undergo lumbar puncture here as he has been on aspirin; although it seems more likely that the symptoms are due to the above. I think it may be worth trying a large volume lumbar puncture in the near future to determine whether it makes a difference, particularly in his ataxia and his incontinence. We will plan to see him back in the office to discuss this and we discussed risks/benefits. The family members are comfortable with this.   Initially, it is felt the patient would need to go somewhere other than home secondary to his ataxia and initially we had to look toward a skilled  rehab. Fortunately, however, the patient showed steady improvement, and although he needs 24-hour supervision it was felt that he would be able to go home with family members watching him and with assistance of home health nursing, physical therapy and  nurse's aide, which were arranged.   At this time, he will be discharged home in stable condition. Physical activity up with walker as tolerated. Regular diet. Oxygen 2 liters nasal cannula with portable tank were ordered. We will have him follow up in our office in the next 1 to 2 weeks.   DISCHARGE MEDICATIONS: 1. Aricept 5 mg p.o. at bedtime.  2. Metoprolol tartrate 25 mg p.o. b.i.d.  3. Neurontin 300 mg p.o. t.i.d.  4. Flomax 0.4 mg 2 capsules p.o. at bedtime.  5. Atorvastatin 10 mg at bedtime.  6. Sinemet 25/100, one p.o. t.i.d.  7. Tramadol 50 mg p.o. t.i.d. p.r.n. pain.  8. Symbicort 80/4.5, 2 puffs b.i.d.  9. Amlodipine 2.5 mg p.o. daily.  10. Combivent metered dose inhaler 1 puff 4 times a day.   He was given instructions to hold Zofran.   ____________________________ Adin Hector, MD bjk:sg D: 03/15/2014 12:36:26 ET T: 03/15/2014 13:29:13 ET JOB#: [id number]  cc: Tama High III, MD, <Dictator> Ramonita Lab MD ELECTRONICALLY SIGNED 03/15/2014 17:07

## 2015-04-09 NOTE — Consult Note (Signed)
PATIENT NAME:  Corey Hughes, FEBLES MR#:  [id number] DATE OF BIRTH:  [birth year]  DATE OF CONSULTATION:  07/30/2014  REFERRING PHYSICIAN:   CONSULTING PHYSICIAN:  Leotis Pain, MD  REASON FOR CONSULTATION: Progressive dementia.   Information obtained from chart and the patient's wife who is at the bedside.   HISTORY OF PRESENT ILLNESS: This is a 74 year old gentleman with past medical history of Parkinson disease and COPD presenting with generalized weakness for the past 3 or 4 days, but looking at the bigger picture, as per his wife, the patient has been deteriorating for the past 3 to 4 months with progressive decline in cognition, urine and sometimes fecal incontinence, poor p.o. intake, and inability to dress himself. The patient has been diagnosed with dementia and is on anticholinergic medication. As per wife, the patient is on Aricept at home. The patient was admitted with acute kidney injury, started on hydration. As per wife, the patient's mental status significantly improved.  PAST MEDICAL HISTORY: Parkinson's, COPD, chronic respiratory failure on 2 liters O2, rheumatoid arthritis.   SOCIAL HISTORY: Occasional tobacco use.   ALLERGIES: No known drug allergies.  REVIEW OF SYSTEMS: No fever, no chills. No weakness on one side of the body compared to the other. No anxiety. No depression. No abdominal pain. No palpitations. No chest pain. Currently no shortness of breath. No anxiety or depression.   PHYSICAL EXAMINATION: VITAL SIGNS: Include a temperature of 97.6, pulse 69, respiration 18, blood pressure 159/69. NEUROLOGIC: He is actually awake and alert to his name and the type of facility. Could not tell me the date and time. Calculation is severely impaired. Attention is impaired. Extraocular movements are intact. Facial sensation intact. Facial motor is intact. Tongue is midline. Uvula elevates symmetrically. Shoulder shrug is intact. Motor appears to be 4+/5 bilaterally. Reflexes  severely diminished. Sensation intact to light touch and temperature. Coordination intact. Gait not assessed.   IMPRESSION: A 74 year old gentleman with a history of Parkinson disease and chronic obstructive pulmonary disease presenting with 3 months history of progressive deterioration, difficulty taking care of himself, poor p.o. intake. Presented with acute kidney injury and appears to be dehydrated. Currently on fluids. Mental status significantly improved, as per wife, currently.   PLAN: The patient should follow up with a neurologist as an outpatient for continued dementia work-up and possibility of medication change. The patient's wife does state that he is on Aricept, which I do not see in our system. I also believe that the patient's wife needs assistance, either has to be visiting nurse service or possibility of skilled nursing facility, which the patient's wife is struggling with. At this point, no further imaging, no further inpatient neurological testing. The patient should be followed with neurology as an outpatient.   Thank you, it was a pleasure seeing this patient.   ____________________________ Leotis Pain, MD yz:sb D: 07/30/2014 13:29:09 ET T: 07/30/2014 13:40:49 ET JOB#: [id number]  cc: Leotis Pain, MD, <Dictator> Leotis Pain MD ELECTRONICALLY SIGNED 08/20/2014 16:06

## 2015-04-09 NOTE — Discharge Summary (Signed)
PATIENT NAME:  Corey Hughes, Corey Hughes MR#:  [id number] DATE OF BIRTH:  January 06, 1941  DATE OF ADMISSION:  07/29/2014 DATE OF DISCHARGE:  08/02/2014  FINAL DIAGNOSES: 1.  Dehydration.  2.  Chronic respiratory failure.  3.  Acute exacerbation of chronic obstructive pulmonary disease.  4.  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  5.  Parkinsonism.  6.  Osteoarthritis.   HISTORY AND PHYSICAL: Please see dictated admission history and physical.   HOSPITAL COURSE: The patient was admitted with progressive weakness, difficulty with hygiene, falls, was found to have evidence of dehydration. He was hydrated and his strength did improve, although he continued to have trouble ambulating without assistance. His respiratory status appeared to be slightly worse than baseline, although he was maintained at his home rate of 2 liters nasal cannula. He was placed on treatment for COPD exacerbation, and he did show improvement.   His wife has been his primary caretaker, and she has had progressive difficulty caring for him at home because of his dementia, and his generalized lack of interest in ADLs as well as increasing difficulty performing ADLs.  It was felt the patient would benefit from rehabilitation to see what maximum strength and functioning we could get him to, and then further decisions may need to be made on whether he might in fact need longer term memory unit or whether he is going to be able to return home, depending on what his wife is comfortable with.   DISCHARGE MEDICATIONS: 1.  Lopressor 25 mg p.o. b.i.d.  2.  Tramadol 50 mg p.o. t.i.d. p.r.n. pain.  3.  Neurontin 300 mg p.o. t.i.d.  4.  Lipitor 10 mg p.o. at bedtime.  5.  Tamsulosin 0.4 mg 2 capsules p.o. at bedtime for BPH.  6.  Prednisone taper starting at 30 mg daily decreasing x 10 mg every 2 days until done.  7.  Sinemet 25/100 one p.o. b.i.d. with meals.  8.  DuoNeb SVN 4 times a day.  9.  Levofloxacin 500 mg  p.o. daily x7 days to complete an antibiotic course. 10.  Ensure 1 can p.o. daily.  11.  Advair 250/50 one puff b.i.d.   DISCHARGE INSTRUCTIONS: His diet will be no added salt. His physical activity should be up as tolerated with a walker with assistance. He will be on oxygen 2 liters nasal cannula continuously. He will follow up with the nursing home physician as scheduled. Physical therapy should evaluate and treat the patient.   Code status during this patient's hospitalization was reviewed with the patient and family members. The patient is DO NOT RESUSCITATE. An out of facility DNR form was sent with him to the nursing facility.  TIME SPENT ON DISCHARGE: 40 minutes.  ____________________________ Adin Hector, MD bjk:sb D: 08/02/2014 12:37:52 ET T: 08/02/2014 12:44:23 ET JOB#: [id number]  cc: Tama High III, MD, <Dictator> Ramonita Lab MD ELECTRONICALLY SIGNED 08/05/2014 10:22

## 2015-04-09 NOTE — Consult Note (Signed)
   Comments   Meeting with pt's wife, Corey Hughes.  Had extensive conversation about pt's condition over the last three months and caregiver situation at home. Wife states her husband painted a better picture than the reality at home.  Pt has had a decline over the last 3 months and is total care at home.  Pt cannot dress himself or bathe himself at home.  Pt's PO intake has steadily decreased.  Wife states pt will feed food to dog and state he has eaten his food.  Wife has noted weight loss in last 3-4 months. Pt is becoming increasingly unsteady on his feet and has fallen "frequently" at home per wife.  Wife states that pt's incontinent of bowel and bladder is not new and has been going on for months.  Wife states pt can no longer walk more than 15-20 feet without becoming SOB.  Wife is worried b/c she can no longer care for pt at home and is unsure about putting pt in a SNF. Spoke with wife about code status and she states that they have spoken about this and pt has a living will stating he wants to be a DNR.  Wife to bring in document.  Pt's wife asked him about his code status when we entered his room and stated he would never want to on a ventilator or try to have his heart restarted.  Pt states he was confused earlier when I asked him this question. 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

## 2015-04-09 NOTE — Consult Note (Signed)
Referring Physician:  Paris Lore :   Reason for Consult: Admit Date: 11-Mar-2014  Chief Complaint: "I can't do the things I used to do"  Reason for Consult: altered mental status   History of Present Illness: History of Present Illness:   Corey Hughes is a 74 yo man with PMH notable for COPD, prior lacunar strokes, and B12 deficiency who presents to [facility] for evaluation of generalized weakness, diarrhea, and difficulty walking. The Neurology service has been consulted to help evaluate and manage the patient's gait and memory difficulties. The history is gathered from the patient and his wife.  walking problems started about 1.5 years ago and have progressively worsened since then. He and his wife have noticed worsening difficulty starting to walk from a stationary position, stooped and slowed gait, freezing (especially in doorways), and inability to tunr while walking. Over the last few months, he has also had increasing problems with stopping walking - his legs "get away from him" and he is unable to stop until he reaches a wall. He stumbles frequently though has managed to avoid major issues with falls. He uses a cane to help with balance, and has a wheeled walked that he is generally reluctant to use.  wife also notes profound changes in personality over the last year and a half. He seems more impulsive and has started eating more poorly, more junk food than healthy food. He recently started an extramarital affair with another woman and bought a new house on a whim without telling his wife. His short-term memory for events has also seemed to suffer over this same time frame. He occasionally has urinary or fecal incontinence, usually while in the bed. His wife is unsure whether he is significantly bothered by this. She complains that because of his impulsivity, he no longer resembles the man she married. He denies hallucinations and his wife does not think he suffers from this.  However, his sleep has become increasingly poor and he has begun talking more in his sleep lately. my examination, when I ask him to interpret the phrase "Don't cry over spilled milk", he responded "Well it depends on if I'm getting laid or not". His wife notes that these types of inappropriately sexual comments have become more frequent.  ROS:  General fatigue   HEENT no complaints   Lungs SOB   Cardiac no complaints   GI nausea  diarrhea   GU urinary incontinence   Musculoskeletal no complaints   Extremities no complaints   Skin no complaints   Neuro difficulty walking, changes in personality   Psych impulsiveness   Past Medical/Surgical Hx:  copd:   dementia:   parkinsons:   Cancer, Skin:   Rheumatoid Arthritis:   Colon Polypectomy:   Home Medications: Medication Instructions Last Modified Date/Time  Zofran ODT 4 mg oral tablet, disintegrating 1 tab(s)  every 8 hours as needed   26-Mar-15 15:02  metoprolol tartrate 25 mg oral tablet 1 tab(s) orally 2 times a day 26-Mar-15 15:02  donepezil 5 mg oral tablet 1 tab(s) orally once a day (at bedtime) 26-Mar-15 15:02  Neurontin 300 mg oral capsule 1 cap(s) orally 3 times a day (with meals) 26-Mar-15 15:02  Flomax 0.4 mg oral capsule 2 cap(s) orally once a day (at bedtime) 26-Mar-15 15:02  atorvastatin 10 mg oral tablet 1 tab(s) orally once a day (at bedtime) 26-Mar-15 15:02  carbidopa-levodopa 25 mg-100 mg oral tablet 1 tab(s) orally 3 times a day  26-Mar-15 15:02  traMADol 50 mg oral tablet 1 tab(s) orally 3 times a day, As Needed - for Pain 26-Mar-15 15:02   Allergies:  No Known Allergies:   Social/Family History: Lives With: spouse  Living Arrangements: house  Social History: Quit smoking last year although in last few days has tried to smoke a few cigarettes again. Denies EtOH or illicit drug use.  Family History: 2 sisters died with complications from Parkinson's Disease.   Vital Signs: **Vital Signs.:    27-Mar-15 06:38  Vital Signs Type Routine  Temperature Temperature (F) 98.5  Celsius 36.9  Temperature Source oral  Pulse Pulse 67  Respirations Respirations 20  Systolic BP Systolic BP 902  Diastolic BP (mmHg) Diastolic BP (mmHg) 82  Mean BP 102  Pulse Ox % Pulse Ox % 98  Pulse Ox Activity Level  At rest  Oxygen Delivery 2L   EXAM: Well-developed, well-nourished, in NAD. No conjunctival injection or scleral edema. Oropharynx clear. No carotid bruits auscultated. Normal S1, S2 and regular cardiac rhythm on exam. Lungs clear to auscultation bilaterally with slightly decreased air entry bilaterally. Abdomen soft and nontender. Peripheral pulses palpated. No clubbing, cyanosis, or edema in extremities.  MENTAL STATUS: Alert and oriented to person, place, but not time (2002, April). Language fluent and appropriate with intact naming and repetition. He follows simple commands but is unable to follow multi-step commands or perform Luria sequence. He does not have echolalia or echopraxia. He successfully recalls 1/3 words after 5 minutes. Attention and calculation are impaired. He is unable to successfully interpret a simple proverb "Don't cry over spilled milk" CRANIAL NERVES: Visual fields full to confrontation. PERRL. Optic disc margins sharp on fundoscopy. EOMI. Facial sensation intact. Facial muscles full and symmetric. Hearing reduced to finger rub bilaterally. Uvula midline with symmetric palatal elevation. Tongue midline without fasciculations. MOTOR: Tone is mildly increased in the right arm with mild cogwheeling felt about the right wrist. A mild resting tremor is present in both hands. Bradykinesia is present bilaterally, right more than left. He has an ulnar contracture in his left hand from a prior injury. Otherwise, strength 4+/5 in deltoids, biceps, triceps, wrist flexors and extensors, and hand intrinsics bilaterally. Strength 4+/5 in iliopsoas, glutes, hamstrings, quads, and tib ant  bilaterally. REFLEXES: 2+ in biceps, triceps, patella, and achilles bilaterally. Flexor plantar responses bilaterally. SENSORY: Intact to vibration and pinprick throughout without extinction. COORDINATION: There is surprisingly some intention tremor on finger-nose testing (as well as the previously observed resting component), though no frank dysmetria on exam. GAIT: Difficulty initiating gate. Narrow-based, very short steps, shuffling with poor ground clearance and no arm movement. Truncal instability is present. En bloc turning.  Lab Results: Thyroid:  26-Mar-15 10:53   Thyroid Stimulating Hormone 1.80 (0.45-4.50 (International Unit)  ----------------------- Pregnant patients have  different reference  ranges for TSH:  - - - - - - - - - -  Pregnant, first trimetser:  0.36 - 2.50 uIU/mL)  Hepatic:  26-Mar-15 10:53   Bilirubin, Total 0.5  Alkaline Phosphatase 91 (45-117 NOTE: New Reference Range 11/06/13)  SGPT (ALT) 27  SGOT (AST) 16  Total Protein, Serum 6.7  Albumin, Serum 3.5  Routine Micro:  26-Mar-15 16:47   Micro Text Report URINE CULTURE   COMMENT                   NO GROWTH IN 8-12 HOURS   ANTIBIOTIC  Specimen Source CLEAN CATCH  Culture Comment NO GROWTH IN 8-12 HOURS  Result(s) reported on 12 Mar 2014 at 08:27AM.  General Ref:  26-Mar-15 10:53   RPR w/ Reflex to Qt RPR and Conf. TP-PA ========== TEST NAME ==========  ========= RESULTS =========  = REFERENCE RANGE =  RPR W/REFLX QN,CONF TPPA   Routine Chem:  26-Mar-15 10:53   Amylase, Serum  121 (Result(s) reported on 11 Mar 2014 at 12:00PM.)  Lipase 280 (Result(s) reported on 11 Mar 2014 at 11:52AM.)  27-Mar-15 03:58   Glucose, Serum 82  BUN 13  Creatinine (comp) 1.00  Sodium, Serum 142  Potassium, Serum 3.8  Chloride, Serum 104  CO2, Serum  34  Calcium (Total), Serum 8.6  Anion Gap  4  Osmolality (calc) 282  eGFR (African American) >60  eGFR (Non-African American) >60 (eGFR  values <25mL/min/1.73 m2 may be an indication of chronic kidney disease (CKD). Calculated eGFR is useful in patients with stable renal function. The eGFR calculation will not be reliable in acutely ill patients when serum creatinine is changing rapidly. It is not useful in  patients on dialysis. The eGFR calculation may not be applicable to patients at the low and high extremes of body sizes, pregnant women, and vegetarians.)  Cardiac:  26-Mar-15 10:53   Troponin I < 0.02 (0.00-0.05 0.05 ng/mL or less: NEGATIVE  Repeat testing in 3-6 hrs  if clinically indicated. >0.05 ng/mL: POTENTIAL  MYOCARDIAL INJURY. Repeat  testing in 3-6 hrs if  clinically indicated. NOTE: An increase or decrease  of 30% or more on serial  testing suggests a  clinically important change)  Routine UA:  26-Mar-15 16:47   Color (UA) Straw  Clarity (UA) Clear  Glucose (UA) Negative  Bilirubin (UA) Negative  Ketones (UA) Negative  Specific Gravity (UA) 1.003  Blood (UA) Negative  pH (UA) 7.0  Protein (UA) Negative  Nitrite (UA) Negative  Leukocyte Esterase (UA) Negative (Result(s) reported on 11 Mar 2014 at 05:05PM.)  RBC (UA) NONE SEEN  WBC (UA) <1 /HPF  Bacteria (UA) NONE SEEN  Epithelial Cells (UA) NONE SEEN  Result(s) reported on 11 Mar 2014 at 05:05PM.  Routine Hem:  26-Mar-15 10:53   Erythrocyte Sed Rate 9 (Result(s) reported on 11 Mar 2014 at 12:11PM.)  27-Mar-15 03:58   WBC (CBC) 5.0  RBC (CBC)  3.48  Hemoglobin (CBC)  12.0  Hematocrit (CBC)  36.2  Platelet Count (CBC) 243  MCV  104  MCH  34.5  MCHC 33.1  RDW  20.6  Neutrophil % 53.6  Lymphocyte % 37.1  Monocyte % 7.3  Eosinophil % 1.4  Basophil % 0.6  Neutrophil # 2.7  Lymphocyte # 1.9  Monocyte # 0.4  Eosinophil # 0.1  Basophil # 0.0 (Result(s) reported on 12 Mar 2014 at 04:46AM.)   Radiology Results: CT:    26-Mar-15 11:58, CT Head Without Contrast  CT Head Without Contrast   REASON FOR EXAM:    AMS/ataxia  COMMENTS:        PROCEDURE: CT  - CT HEAD WITHOUT CONTRAST  - Mar 11 2014 11:58AM     CLINICAL DATA:  Altered mental status.    EXAM:  CT HEAD WITHOUT CONTRAST    TECHNIQUE:  Contiguous axial images were obtained from the base of the skull  through the vertex without intravenous contrast.    COMPARISON:  MRI scan of January 29, 2013.  FINDINGS:  Bony calvarium appears intact. Mild diffuse cortical atrophy is  noted. Mild  chronic ischemic white matter disease is noted. Old  lacunar infarction is noted in left basal ganglia. Small focal old  right cerebellar infarction is noted. No mass effect or midline  shift is noted. Ventricular size is within normal limits. There is  no evidence of mass lesion, hemorrhage oracute infarction.     IMPRESSION:  Mild diffuse cortical atrophy. Mild chronic ischemic white matter  disease. No acute intracranial abnormality seen.      Electronically Signed    By: Sabino Dick M.D.    On: 03/11/2014 13:14         Verified By: Marveen Reeks, M.D.,   Impression/Recommendations: Recommendations:   Corey Hughes is a 74 yo man who presents for weakness and GI complaints as well as memory/personality and gait changes. 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 On history, he has had major changes in behavior notable for impulsivity and hypersexuality, and has been almost crippled by his deteriorating gait, which has not responded to levodopa/carbidopa initiated by his PCP.  exam findings along with the history point most strongly to a diagnosis of frontotemporal dementia, behavioral variant, with strong features of Parkinsonism. Unfortunately, FTD parkinsonian symptoms tend to respond less well to dopamine agonists than frank Parkinson's disease. His CT scan does show generalized atrophy which may be more pronounced in the frontal lobes, though thisimaging modality is not necessarily specific enough to diagnose FTD  radiologically. However, it does show ventriculomegaly I believe out of proportion to the atrophy, which may suggest a concurrent diagnosis of normal pressure hydrocephalus. This would not explain the dysexecutive behavioral changes that I still believe represent FTD, though may represent a second, separate (and treatable) process that could be affecting Corey Hughes's gait. Lumbar puncture for high-volume CSF removal (recommend 43m), with gait testing by PT as soon as possible after LP to exclude NPH. If gait is not improved by drainage, then unfortunately his gait symptoms are likely exclusively due to the Parkinsonism that appears to accompany his frontotemporal dementia. If it is improved, then would recommend neurosurgical referral for possible ventriculoperitoneal shunt placementContinue Sinemet at current dosing, is unlikely to be helping significantly though would defer changes in this to the outpatient settingNeurology outpatient followup for management of frontotemporal dementia you for the opportunity to participate in Corey Hughes's care. Please page Neurology consults with further questions.  Electronic Signatures: HCarmin Richmond(MD)  (Signed 27-Mar-15 10:19)  Authored: REFERRING PHYSICIAN, Consult, History of Present Illness, Review of Systems, PAST MEDICAL/SURGICAL HISTORY, HOME MEDICATIONS, ALLERGIES, Social/Family History, NURSING VITAL SIGNS, Physical Exam-, LAB RESULTS, RADIOLOGY RESULTS, Recommendations   Last Updated: 27-Mar-15 10:19 by HCarmin Richmond(MD)

## 2015-10-14 ENCOUNTER — Inpatient Hospital Stay
Admission: EM | Admit: 2015-10-14 | Discharge: 2015-10-16 | DRG: 641 | Disposition: A | Discharge status: Skilled Nursing Facility | Payer: Medicare Other | Attending: Internal Medicine | Admitting: Internal Medicine

## 2015-10-14 ENCOUNTER — Emergency Department: Payer: Medicare Other

## 2015-10-14 DIAGNOSIS — E860 Dehydration: Secondary | ICD-10-CM | POA: Diagnosis present

## 2015-10-14 DIAGNOSIS — R509 Fever, unspecified: Secondary | ICD-10-CM | POA: Diagnosis not present

## 2015-10-14 DIAGNOSIS — F329 Major depressive disorder, single episode, unspecified: Secondary | ICD-10-CM | POA: Diagnosis present

## 2015-10-14 DIAGNOSIS — Z85820 Personal history of malignant melanoma of skin: Secondary | ICD-10-CM | POA: Diagnosis not present

## 2015-10-14 DIAGNOSIS — F0390 Unspecified dementia without behavioral disturbance: Secondary | ICD-10-CM | POA: Diagnosis present

## 2015-10-14 DIAGNOSIS — F17200 Nicotine dependence, unspecified, uncomplicated: Secondary | ICD-10-CM | POA: Diagnosis not present

## 2015-10-14 DIAGNOSIS — R4182 Altered mental status, unspecified: Secondary | ICD-10-CM | POA: Diagnosis present

## 2015-10-14 DIAGNOSIS — N400 Enlarged prostate without lower urinary tract symptoms: Secondary | ICD-10-CM | POA: Diagnosis not present

## 2015-10-14 DIAGNOSIS — G8929 Other chronic pain: Secondary | ICD-10-CM | POA: Diagnosis present

## 2015-10-14 DIAGNOSIS — Z23 Encounter for immunization: Secondary | ICD-10-CM | POA: Diagnosis not present

## 2015-10-14 DIAGNOSIS — Z8673 Personal history of transient ischemic attack (TIA), and cerebral infarction without residual deficits: Secondary | ICD-10-CM | POA: Diagnosis not present

## 2015-10-14 DIAGNOSIS — Z79899 Other long term (current) drug therapy: Secondary | ICD-10-CM

## 2015-10-14 DIAGNOSIS — A419 Sepsis, unspecified organism: Secondary | ICD-10-CM | POA: Diagnosis present

## 2015-10-14 DIAGNOSIS — G20 Parkinson's disease: Secondary | ICD-10-CM | POA: Diagnosis present

## 2015-10-14 LAB — URINALYSIS COMPLETE WITH MICROSCOPIC (ARMC ONLY)
Bilirubin Urine: NEGATIVE
Glucose, UA: NEGATIVE mg/dL
Leukocytes, UA: NEGATIVE
Nitrite: NEGATIVE
PROTEIN: 30 mg/dL — AB
SPECIFIC GRAVITY, URINE: 1.027 (ref 1.005–1.030)
pH: 5 (ref 5.0–8.0)

## 2015-10-14 LAB — CBC WITH DIFFERENTIAL/PLATELET
Basophils Absolute: 0 10*3/uL (ref 0–0.1)
Basophils Relative: 0 %
EOS PCT: 0 %
Eosinophils Absolute: 0 10*3/uL (ref 0–0.7)
HEMATOCRIT: 46.5 % (ref 40.0–52.0)
Hemoglobin: 15.7 g/dL (ref 13.0–18.0)
LYMPHS ABS: 0.3 10*3/uL — AB (ref 1.0–3.6)
Lymphocytes Relative: 3 %
MCH: 34.7 pg — AB (ref 26.0–34.0)
MCHC: 33.8 g/dL (ref 32.0–36.0)
MCV: 102.7 fL — AB (ref 80.0–100.0)
MONOS PCT: 6 %
Monocytes Absolute: 0.6 10*3/uL (ref 0.2–1.0)
Neutro Abs: 9.8 10*3/uL — ABNORMAL HIGH (ref 1.4–6.5)
Neutrophils Relative %: 91 %
PLATELETS: 225 10*3/uL (ref 150–440)
RBC: 4.53 MIL/uL (ref 4.40–5.90)
RDW: 18.2 % — AB (ref 11.5–14.5)
WBC: 10.8 10*3/uL — ABNORMAL HIGH (ref 3.8–10.6)

## 2015-10-14 LAB — INFLUENZA PANEL BY PCR (TYPE A & B)
H1N1FLUPCR: NOT DETECTED
INFLAPCR: NEGATIVE
INFLBPCR: NEGATIVE

## 2015-10-14 LAB — BLOOD GAS, ARTERIAL
ACID-BASE DEFICIT: 0.2 mmol/L (ref 0.0–2.0)
Bicarbonate: 25.4 mEq/L (ref 21.0–28.0)
FIO2: 0.32
O2 SAT: 95 %
PATIENT TEMPERATURE: 37
PCO2 ART: 44 mmHg (ref 32.0–48.0)
PO2 ART: 78 mmHg — AB (ref 83.0–108.0)
pH, Arterial: 7.37 (ref 7.350–7.450)

## 2015-10-14 LAB — COMPREHENSIVE METABOLIC PANEL
ALBUMIN: 3.2 g/dL — AB (ref 3.5–5.0)
ALT: 10 U/L — ABNORMAL LOW (ref 17–63)
AST: 21 U/L (ref 15–41)
Alkaline Phosphatase: 72 U/L (ref 38–126)
Anion gap: 9 (ref 5–15)
BILIRUBIN TOTAL: 0.4 mg/dL (ref 0.3–1.2)
BUN: 26 mg/dL — AB (ref 6–20)
CHLORIDE: 97 mmol/L — AB (ref 101–111)
CO2: 28 mmol/L (ref 22–32)
Calcium: 8.6 mg/dL — ABNORMAL LOW (ref 8.9–10.3)
Creatinine, Ser: 1.19 mg/dL (ref 0.61–1.24)
GFR calc Af Amer: >60 mL/min (ref >60)
GFR calc non Af Amer: 58 mL/min — ABNORMAL LOW (ref >60)
GLUCOSE: 169 mg/dL — AB (ref 65–99)
POTASSIUM: 3.9 mmol/L (ref 3.5–5.1)
SODIUM: 134 mmol/L — AB (ref 135–145)
Total Protein: 6.6 g/dL (ref 6.5–8.1)

## 2015-10-14 LAB — LIPASE, BLOOD: Lipase: 29 U/L (ref 11–51)

## 2015-10-14 LAB — LACTIC ACID, PLASMA
Lactic Acid, Venous: 1.4 mmol/L (ref 0.5–2.0)
Lactic Acid, Venous: 1.3 mmol/L (ref 0.5–2.0)

## 2015-10-14 LAB — TROPONIN I: Troponin I: 0.04 ng/mL — ABNORMAL HIGH (ref <0.031)

## 2015-10-14 MED ORDER — SODIUM CHLORIDE 0.9 % IV BOLUS (SEPSIS)
500.0000 mL | INTRAVENOUS | Status: AC
  Administered 2015-10-14: 500 mL via INTRAVENOUS

## 2015-10-14 MED ORDER — SODIUM CHLORIDE 0.9 % IV SOLN
INTRAVENOUS | Status: DC
  Administered 2015-10-15 – 2015-10-16 (×4): via INTRAVENOUS

## 2015-10-14 MED ORDER — PIPERACILLIN-TAZOBACTAM 3.375 G IVPB
3.3750 g | Freq: Once | INTRAVENOUS | Status: AC
  Administered 2015-10-14: 3.375 g via INTRAVENOUS

## 2015-10-14 MED ORDER — BUPROPION HCL ER (SR) 150 MG PO TB12
150.0000 mg | ORAL_TABLET | Freq: Two times a day (BID) | ORAL | Status: DC
  Administered 2015-10-15 – 2015-10-16 (×3): 150 mg via ORAL
  Filled 2015-10-14 (×4): qty 1

## 2015-10-14 MED ORDER — GABAPENTIN 300 MG PO CAPS
300.0000 mg | ORAL_CAPSULE | Freq: Every day | ORAL | Status: DC
  Administered 2015-10-15: 300 mg via ORAL
  Filled 2015-10-14: qty 1

## 2015-10-14 MED ORDER — HEPARIN SODIUM (PORCINE) 5000 UNIT/ML IJ SOLN
5000.0000 [IU] | Freq: Three times a day (TID) | INTRAMUSCULAR | Status: DC
  Administered 2015-10-15 – 2015-10-16 (×5): 5000 [IU] via SUBCUTANEOUS
  Filled 2015-10-14 (×5): qty 1

## 2015-10-14 MED ORDER — IPRATROPIUM-ALBUTEROL 0.5-2.5 (3) MG/3ML IN SOLN: 3.0000 mL | RESPIRATORY_TRACT | Status: DC | PRN

## 2015-10-14 MED ORDER — ACETAMINOPHEN 325 MG PO TABS: 650.0000 mg | ORAL_TABLET | Freq: Four times a day (QID) | ORAL | Status: DC | PRN

## 2015-10-14 MED ORDER — VANCOMYCIN HCL IN DEXTROSE 1-5 GM/200ML-% IV SOLN
1000.0000 mg | Freq: Once | INTRAVENOUS | Status: AC
  Administered 2015-10-14: 1000 mg via INTRAVENOUS

## 2015-10-14 MED ORDER — VANCOMYCIN HCL IN DEXTROSE 1-5 GM/200ML-% IV SOLN
INTRAVENOUS | Status: AC
  Administered 2015-10-14: 1000 mg via INTRAVENOUS
  Filled 2015-10-14: qty 200

## 2015-10-14 MED ORDER — SODIUM CHLORIDE 0.9 % IV BOLUS (SEPSIS)
1000.0000 mL | INTRAVENOUS | Status: AC
  Administered 2015-10-14 (×2): 1000 mL via INTRAVENOUS

## 2015-10-14 MED ORDER — PIPERACILLIN-TAZOBACTAM 3.375 G IVPB
INTRAVENOUS | Status: AC
  Administered 2015-10-14: 3.375 g via INTRAVENOUS
  Filled 2015-10-14: qty 50

## 2015-10-14 MED ORDER — ACETAMINOPHEN 650 MG RE SUPP: 650.0000 mg | Freq: Four times a day (QID) | RECTAL | Status: DC | PRN

## 2015-10-14 NOTE — Progress Notes (Signed)
Spoke with MD Harrel Lemon elevated troponin 0.04. Order to place Redraw troponin at 0400

## 2015-10-14 NOTE — ED Provider Notes (Signed)
[facility] Emergency Department Provider Note  ____________________________________________  Time seen: Approximately 8:24 PM  I have reviewed the triage vital signs and the nursing notes.   HISTORY  Chief Complaint Altered Mental Status    HPI Corey Hughes is a 74 y.o. male patient has a history of incisions and mild dementia. Patient has had difficulty with a lot of weakness in the last 2 days is benign but ABLE TO SIT UP OR WALK PROPERLY. PATIENT IS ADMITTED HAVING UP PRODUCTIVE COUGH FOR THE LAST COUPLE DAYS AND LOW-GRADE FEVER. PATIENT HAS NOT BEEN HIMSELF. I AM UNSURE AS To WHY EVERYTHING IS BEINGcapitalized.   Past Medical History  Diagnosis Date  . Chronic pain   . BPH (benign prostatic hyperplasia)   . Melanoma   . Tobacco abuse   . Pneumonia February 2013  . 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 Anemia   . Depression     Patient Active Problem List   Diagnosis Date Noted  . Sepsis ([place]) 10/14/2015  . Tobacco abuse 02/03/2012  . Hypoxia 02/02/2012  . Anemia 02/02/2012  . CAP (community acquired pneumonia) 02/01/2012  . Hyponatremia 02/01/2012  . Generalized weakness 02/01/2012  . Memory loss, short term 02/01/2012    Past Surgical History  Procedure Laterality Date  . Skin surgery      for melanoma    Current Outpatient Rx  Name  Route  Sig  Dispense  Refill  . EXPIRED: albuterol-ipratropium (COMBIVENT) 18-103 MCG/ACT inhaler   Inhalation   Inhale 2 puffs into the lungs every 4 (four) hours as needed for wheezing or shortness of breath.   1 Inhaler   1   . buPROPion (WELLBUTRIN SR) 150 MG 12 hr tablet   Oral   Take 150 mg by mouth 2 (two) times daily.         Marland Kitchen gabapentin (NEURONTIN) 300 MG capsule   Oral   Take 300 mg by mouth 5 (five) times daily.         . Tamsulosin HCl (FLOMAX) 0.4 MG CAPS   Oral   Take 0.4 mg by mouth daily.            Allergies Morphine and  related  No family history on file.  Social History Social History  Substance Use Topics  . Smoking status: Current Every Day Smoker -- 2.00 packs/day for 50 years  . Smokeless tobacco: Not on file  . Alcohol Use: Yes     Comment: occasional    Review of Systems onstitutional:fever/chills Eyes: No visual changes. ENT: No sore throat. Cardiovascular: Denies chest pain. Respiratory: Denies shortness of breath. Gastrointestinal: No abdominal pain.  No nausea, no vomiting.  No diarrhea.  No constipation. Genitourinary: Negative for dysuria. Musculoskeletal: Negative for back pain. Skin: Negative for rash.  10-point ROS otherwise negative.  ____________________________________________   PHYSICAL EXAM:  VITAL SIGNS: ED Triage Vitals  Enc Vitals Group     BP 10/14/15 1914 103/62 mmHg     Pulse Rate 10/14/15 1914 127     Resp 10/14/15 1914 23     Temp 10/14/15 1914 99.3 F (37.4 C)     Temp Source 10/14/15 1914 Oral     SpO2 10/14/15 1914 96 %     Weight 10/14/15 1914 165 lb (74.844 kg)     Height 10/14/15 1914 6' (1.829 m)     Head Cir --      Peak  Flow --      Pain Score 10/14/15 1915 0     Pain Loc --      Pain Edu? --      Excl. in [place]? --     Constitutional: Alert and oriented. Patient is weak clammy and has difficulty sitting up Eyes: Conjunctivae are normal. PERRL. EOMI. Head: Atraumatic. Nose: No congestion/rhinnorhea. Mouth/Throat: Mucous membranes are moist.  Oropharynx non-erythematous. Neck: No stridor.  Cardiovascular: Normal rate, regular rhythm. Grossly normal heart sounds.  Good peripheral circulation. Respiratory: Normal respiratory effort.  No retractions. Lungs scattered wheezes crackles in right base. Gastrointestinal: Soft and nontender. No distention. No abdominal bruits. No CVA tenderness. Musculoskeletal: No lower extremity tenderness nor edema.  No joint effusions. Neurologic:  Normal speech and language.  Skin:  Patient has scattered  bullae some of these are hemorrhagic. Patient has a diagnosis of bullous pemphigoid.   ____________________________________________   LABS (all labs ordered are listed, but only abnormal results are displayed)  Labs Reviewed  COMPREHENSIVE METABOLIC PANEL - Abnormal; Notable for the following:    Sodium 134 (*)    Chloride 97 (*)    Glucose, Bld 169 (*)    BUN 26 (*)    Calcium 8.6 (*)    Albumin 3.2 (*)    ALT 10 (*)    GFR calc non Af Amer 58 (*)    All other components within normal limits  CBC WITH DIFFERENTIAL/PLATELET - Abnormal; Notable for the following:    WBC 10.8 (*)    MCV 102.7 (*)    MCH 34.7 (*)    RDW 18.2 (*)    Neutro Abs 9.8 (*)    Lymphs Abs 0.3 (*)    All other components within normal limits  URINALYSIS COMPLETEWITH MICROSCOPIC ([facility] ONLY) - Abnormal; Notable for the following:    Color, Urine YELLOW (*)    APPearance HAZY (*)    Ketones, ur TRACE (*)    Hgb urine dipstick 2+ (*)    Protein, ur 30 (*)    Bacteria, UA RARE (*)    Squamous Epithelial / LPF 0-5 (*)    All other components within normal limits  BLOOD GAS, ARTERIAL - Abnormal; Notable for the following:    pO2, Arterial 78 (*)    All other components within normal limits  CULTURE, BLOOD (ROUTINE X 2)  CULTURE, BLOOD (ROUTINE X 2)  URINE CULTURE  LACTIC ACID, PLASMA  LACTIC ACID, PLASMA  LIPASE, BLOOD  TROPONIN I  INFLUENZA PANEL BY PCR (TYPE A & B, H1N1)   ____________________________________________  EKG  EKG is not currently available was but was sinus tachycardic ____________________________________________  RADIOLOGY  Chest x-ray read by radiology as clear ____________________________________________   PROCEDURES    ____________________________________________   INITIAL IMPRESSION / ASSESSMENT AND PLAN / ED COURSE  Pertinent labs & imaging results that were available during my care of the patient were reviewed by me and considered in my medical decision  making (see chart for details).   ____________________________________________   FINAL CLINICAL IMPRESSION(S) / ED DIAGNOSES  Final diagnoses:  Sepsis, due to unspecified organism [facility])      Nena Polio, MD 10/14/15 2224

## 2015-10-14 NOTE — ED Notes (Addendum)
Pt arrived via EMS from home.  Reported that patient had some right sided weakness that started last night per EMS wife says his right side "locked up."  Increasing lethargy and not responding the way he normally does per wife.

## 2015-10-14 NOTE — ED Notes (Signed)
Respiratory notified to do ABG

## 2015-10-14 NOTE — H&P (Signed)
Corey Hughes is an 74 y.o. male.   Chief Complaint: Weakness HPI: Pt has Parkinsons and is only able to answer yes or no to most questions. Wife at bedside providing history. Started getting weak yesterday to the point he could not walk or feed himself. C/O shortness of breath, no cough. No N/V. No sick contacts. Did not get flu shot yet.  Past Medical History  Diagnosis Date  . Chronic pain   . BPH (benign prostatic hyperplasia)   . Melanoma   . Tobacco abuse   . Pneumonia February 2013  . 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 Anemia   . Depression     Past Surgical History  Procedure Laterality Date  . Skin surgery      for melanoma    No family history on file.  No history of CAD  Social History:  reports that he has been smoking.  He does not have any smokeless tobacco history on file. He reports that he drinks alcohol. He reports that he does not use illicit drugs.  Allergies:  Allergies  Allergen Reactions  . Morphine And Related Shortness Of Breath     (Not in a hospital admission)  Results for orders placed or performed during the hospital encounter of 10/14/15 (from the past 48 hour(s))  Comprehensive metabolic panel     Status: Abnormal   Collection Time: 10/14/15  7:27 PM  Result Value Ref Range   Sodium 134 (L) 135 - 145 mmol/L   Potassium 3.9 3.5 - 5.1 mmol/L   Chloride 97 (L) 101 - 111 mmol/L   CO2 28 22 - 32 mmol/L   Glucose, Bld 169 (H) 65 - 99 mg/dL   BUN 26 (H) 6 - 20 mg/dL   Creatinine, Ser 1.19 0.61 - 1.24 mg/dL   Calcium 8.6 (L) 8.9 - 10.3 mg/dL   Total Protein 6.6 6.5 - 8.1 g/dL   Albumin 3.2 (L) 3.5 - 5.0 g/dL   AST 21 15 - 41 U/L   ALT 10 (L) 17 - 63 U/L   Alkaline Phosphatase 72 38 - 126 U/L   Total Bilirubin 0.4 0.3 - 1.2 mg/dL   GFR calc non Af Amer 58 (L) >60 mL/min   GFR calc Af Amer >60 >60 mL/min    Comment: (NOTE) The eGFR has been calculated using the CKD EPI equation. This calculation has not  been validated in all clinical situations. eGFR's persistently <60 mL/min signify possible Chronic Kidney Disease.    Anion gap 9 5 - 15  CBC with Differential     Status: Abnormal   Collection Time: 10/14/15  7:27 PM  Result Value Ref Range   WBC 10.8 (H) 3.8 - 10.6 K/uL   RBC 4.53 4.40 - 5.90 MIL/uL   Hemoglobin 15.7 13.0 - 18.0 g/dL   HCT 46.5 40.0 - 52.0 %   MCV 102.7 (H) 80.0 - 100.0 fL   MCH 34.7 (H) 26.0 - 34.0 pg   MCHC 33.8 32.0 - 36.0 g/dL   RDW 18.2 (H) 11.5 - 14.5 %   Platelets 225 150 - 440 K/uL   Neutrophils Relative % 91 %   Neutro Abs 9.8 (H) 1.4 - 6.5 K/uL   Lymphocytes Relative 3 %   Lymphs Abs 0.3 (L) 1.0 - 3.6 K/uL   Monocytes Relative 6 %   Monocytes Absolute 0.6 0.2 - 1.0 K/uL   Eosinophils Relative 0 %   Eosinophils  Absolute 0.0 0 - 0.7 K/uL   Basophils Relative 0 %   Basophils Absolute 0.0 0 - 0.1 K/uL  Lactic acid, plasma     Status: None   Collection Time: 10/14/15  7:29 PM  Result Value Ref Range   Lactic Acid, Venous 1.4 0.5 - 2.0 mmol/L  Urinalysis complete, with microscopic ([facility] only)     Status: Abnormal   Collection Time: 10/14/15  8:03 PM  Result Value Ref Range   Color, Urine YELLOW (A) YELLOW   APPearance HAZY (A) CLEAR   Glucose, UA NEGATIVE NEGATIVE mg/dL   Bilirubin Urine NEGATIVE NEGATIVE   Ketones, ur TRACE (A) NEGATIVE mg/dL   Specific Gravity, Urine 1.027 1.005 - 1.030   Hgb urine dipstick 2+ (A) NEGATIVE   pH 5.0 5.0 - 8.0   Protein, ur 30 (A) NEGATIVE mg/dL   Nitrite NEGATIVE NEGATIVE   Leukocytes, UA NEGATIVE NEGATIVE   RBC / HPF 6-30 0 - 5 RBC/hpf   WBC, UA 0-5 0 - 5 WBC/hpf   Bacteria, UA RARE (A) NONE SEEN   Squamous Epithelial / LPF 0-5 (A) NONE SEEN   Mucous PRESENT    Dg Chest Portable 1 View  10/14/2015  CLINICAL DATA:  Nonresponsive. History of asthma. History of melanoma. EXAM: PORTABLE CHEST 1 VIEW COMPARISON:  July 31, 2014 FINDINGS: There is no edema or consolidation. The heart size and pulmonary  vascularity are normal. No adenopathy. No bone lesions are appreciable. IMPRESSION: No edema or consolidation. Electronically Signed   By: Lowella Grip III M.D.   On: 10/14/2015 19:57    Review of Systems  Constitutional: Positive for fever.  HENT: Negative for hearing loss.   Eyes: Negative for blurred vision.  Respiratory: Positive for shortness of breath.   Cardiovascular: Negative for chest pain.  Gastrointestinal: Negative for nausea and vomiting.  Genitourinary: Negative for dysuria.  Musculoskeletal: Negative for back pain.  Skin: Negative for rash.  Neurological: Positive for tremors and weakness. Negative for dizziness.  Endo/Heme/Allergies: Does not bruise/bleed easily.    Blood pressure 103/62, pulse 127, temperature 99.3 F (37.4 C), temperature source Oral, resp. rate 23, height 6' (1.829 m), weight 74.844 kg (165 lb), SpO2 96 %. Physical Exam  Constitutional: He is oriented to person, place, and time.  Ill appearing male in mild distress  HENT:  Head: Normocephalic and atraumatic.  Mouth/Throat: No oropharyngeal exudate.  Oral mucosa dry  Eyes: EOM are normal. Pupils are equal, round, and reactive to light. No scleral icterus.  Neck: Normal range of motion. Neck supple. No JVD present. No tracheal deviation present. No thyromegaly present.  Cardiovascular: Normal rate.   No murmur heard. Respiratory:  Clear to ascultation No dullness to percussion No use of accessary muscles  GI: Soft. Bowel sounds are normal. He exhibits no mass. There is no tenderness.  Musculoskeletal: He exhibits no edema or tenderness.  Srtength 4/5 bilaterally upper and lower ext  Lymphadenopathy:    He has no cervical adenopathy.  Neurological: He is alert and oriented to person, place, and time. No cranial nerve deficit.  Mild tremor.  Skin: Skin is warm and dry.  Bulluos lesions on left foot.     Assessment/Plan 1. Sepsis: Fever, tachycardia and mild elevated WBC count.  Suspect infection but source unknown. Will treat aggressively with fluids and abx. Get flu swab.  2. Septic Shock: BP dropped to 80's but is responding to IV fluids. Will continue fluid recussitation and use pressors if needed.  3.  Tachycardia: Secondary to sepsis. Will monitor.  4. Dehydration: Secondary to poor PO intake and fever. IVF.  5. Bullous Pemphidiod: Chronic. Followed by Dr Gigi Gin as outpt.  Case discussed with Dr Cinda Quest. Past records reviewed.  Time spent = 86mn  JBaxter Hire10/28/2016, 9:25 PM

## 2015-10-15 ENCOUNTER — Inpatient Hospital Stay: Payer: Medicare Other

## 2015-10-15 LAB — CBC
HCT: 42.1 % (ref 40.0–52.0)
Hemoglobin: 13.9 g/dL (ref 13.0–18.0)
MCH: 34.1 pg — AB (ref 26.0–34.0)
MCHC: 33.1 g/dL (ref 32.0–36.0)
MCV: 103.1 fL — ABNORMAL HIGH (ref 80.0–100.0)
PLATELETS: 197 10*3/uL (ref 150–440)
RBC: 4.08 MIL/uL — AB (ref 4.40–5.90)
RDW: 18.2 % — AB (ref 11.5–14.5)
WBC: 11.6 10*3/uL — ABNORMAL HIGH (ref 3.8–10.6)

## 2015-10-15 LAB — BASIC METABOLIC PANEL
Anion gap: 6 (ref 5–15)
BUN: 23 mg/dL — AB (ref 6–20)
CALCIUM: 7.7 mg/dL — AB (ref 8.9–10.3)
CO2: 25 mmol/L (ref 22–32)
CREATININE: 0.98 mg/dL (ref 0.61–1.24)
Chloride: 105 mmol/L (ref 101–111)
GFR calc non Af Amer: >60 mL/min (ref >60)
Glucose, Bld: 106 mg/dL — ABNORMAL HIGH (ref 65–99)
Potassium: 4.1 mmol/L (ref 3.5–5.1)
Sodium: 136 mmol/L (ref 135–145)

## 2015-10-15 LAB — TROPONIN I: TROPONIN I: 0.04 ng/mL — AB (ref <0.031)

## 2015-10-15 LAB — MRSA PCR SCREENING: MRSA by PCR: NEGATIVE

## 2015-10-15 MED ORDER — INFLUENZA VAC SPLIT QUAD 0.5 ML IM SUSY
0.5000 mL | PREFILLED_SYRINGE | INTRAMUSCULAR | Status: AC
  Administered 2015-10-16: 0.5 mL via INTRAMUSCULAR
  Filled 2015-10-15: qty 0.5

## 2015-10-15 MED ORDER — PIPERACILLIN-TAZOBACTAM 3.375 G IVPB
3.3750 g | Freq: Three times a day (TID) | INTRAVENOUS | Status: DC
  Administered 2015-10-15 – 2015-10-16 (×5): 3.375 g via INTRAVENOUS
  Filled 2015-10-15 (×6): qty 50

## 2015-10-15 MED ORDER — GABAPENTIN 300 MG PO CAPS
300.0000 mg | ORAL_CAPSULE | Freq: Three times a day (TID) | ORAL | Status: DC
  Administered 2015-10-15 – 2015-10-16 (×4): 300 mg via ORAL
  Filled 2015-10-15 (×4): qty 1

## 2015-10-15 MED ORDER — AMOXICILLIN-POT CLAVULANATE 875-125 MG PO TABS: 1.0000 | ORAL_TABLET | Freq: Two times a day (BID) | ORAL | Status: DC

## 2015-10-15 NOTE — Progress Notes (Signed)
Spoke with MD david willis troponin level 0.04. Will cont to monitor

## 2015-10-15 NOTE — Progress Notes (Signed)
Wife concerned about the patients swallowing.  Pt is coughing, lung sounds decreased and expiratory wheezing.  Patient changed to NPO and speech consult ordered

## 2015-10-15 NOTE — Evaluation (Signed)
Physical Therapy Evaluation Patient Details Name: Corey Hughes MRN: [id number] DOB: Oct 08, 1941 Today's Date: 10/15/2015   History of Present Illness  Pt has Parkinsons disease with dementia so history obtained from wife. Pt is AOx2 at time of evaluation. Wife at bedside providing history. Started getting weak yesterday to the point he could not walk or feed himself. C/O shortness of breath, no cough. No N/V. No sick contacts. Did not get flu shot yet. At baseline wife reports pt is ambulatory for household distances. He has a walker but does not use it. Pt has fallen in the last 12 months but wife unable to provide number. States that he is more confused than normal. Typically pt is independent with ADLs but recently has needed assist from wife. Assist from family with IADLs.   Clinical Impression  Pt demonstrates generalized weakness and poor command follow with therapist on this date. Pt requires maxA+1 for all bed mobility and transfers. He is unable to ambulate at this time. Pt with poor sitting balance requiring assist to prevent constant posterior LOB. Pt with poor command follow so unable to participate with MMT. No flaccidity noted in extremities and no LE clonus present with testing. No facial droop or slurring of speech noted. Wife reports that current cognitive status is deviated from patient's baseline. Per wife pt was ambulating without assistive device just last week. RN notified and MD paged to alert of change in status. Care management team notified of discharge recommendations. Pt will benefit from skilled PT services to address deficits in strength, balance, and mobility in order to return to full function at home.     Follow Up Recommendations SNF    Equipment Recommendations  None recommended by PT    Recommendations for Other Services       Precautions / Restrictions Precautions Precautions: Fall Restrictions Weight Bearing Restrictions: No      Mobility  Bed  Mobility Overal bed mobility: Needs Assistance Bed Mobility: Supine to Sit;Sit to Supine     Supine to sit: Max assist Sit to supine: Max assist   General bed mobility comments: Pt will not follow commands to sit up at EOB. Pt starts to initiate motion and then stops. Pt instructed 4-5 times to perform activity but continues to be unable to perform. Eventually therapist must provide maxA transfer to EOB. Once upright pt unable to remain sitting at EOB independently. Pt falls backwards and intermittently to the R. With cues for reaching pt able to anteriorly weight shift independently but then immediately falls backwards.  Transfers Overall transfer level: Needs assistance Equipment used: Rolling walker (2 wheeled) Transfers: Sit to/from Stand Sit to Stand: Max assist         General transfer comment: Pt provided heavy verbal and tactile cues as well as hand over hand assistance to perform sit to stand transfer. MaxA required and pt with posterior lean and inability to extend knees and hips. Unclear if this is due to poor command follow or weakness. Unable to attempt ambulation at this time.   Ambulation/Gait                Stairs            Wheelchair Mobility    Modified Rankin (Stroke Patients Only)       Balance Overall balance assessment: Needs assistance Sitting-balance support: Bilateral upper extremity supported;Feet supported Sitting balance-Leahy Scale: Poor Sitting balance - Comments: Pt continues to fall posterior and intermittently to the R during sitting  at EOB. Will not respond to commands. When questioned why he is falling pt repeats, "I'm going to lay down." Pt is abel to follow limited commands for reaching. At end of session pt able to maintain short bouts of sitting with CGA only.    Standing balance support: Bilateral upper extremity supported Standing balance-Leahy Scale: Poor                               Pertinent Vitals/Pain  Pain Assessment: No/denies pain    Home Living Family/patient expects to be discharged to:: Unsure Living Arrangements: Spouse/significant other Available Help at Discharge: Family Type of Home: House Home Access: Ramped entrance     Home Layout: One level Home Equipment: Environmental consultant - 2 wheels;Shower seat;Wheelchair - manual (no grab bars)      Prior Function Level of Independence: Needs assistance   Gait / Transfers Assistance Needed: Previously ambulating with intermittent use of rolling walker. Immediately prior to admission wife reports pt was too weak to ambulate  ADL's / Homemaking Assistance Needed: Recently has been requiring assistance        Hand Dominance        Extremity/Trunk Assessment   Upper Extremity Assessment: Difficult to assess due to impaired cognition (Poor command follow, unable to complete MMT)           Lower Extremity Assessment: Difficult to assess due to impaired cognition (Poor command follow, unable to complete MMT)         Communication   Communication:  (Clear speech however pt is confused)  Cognition Arousal/Alertness: Awake/alert Behavior During Therapy: WFL for tasks assessed/performed Overall Cognitive Status: Impaired/Different from baseline Area of Impairment: Orientation Orientation Level: Time;Situation             General Comments: Wife reports that pt is deviated from his baseline cognition. He would typically be able to follow commands    General Comments      Exercises        Assessment/Plan    PT Assessment Patient needs continued PT services  PT Diagnosis Difficulty walking;Generalized weakness   PT Problem List Decreased strength;Decreased activity tolerance;Decreased balance;Decreased cognition;Decreased safety awareness  PT Treatment Interventions DME instruction;Gait training;Functional mobility training;Therapeutic activities;Therapeutic exercise;Neuromuscular re-education;Balance  training;Patient/family education;Wheelchair mobility training   PT Goals (Current goals can be found in the Care Plan section) Acute Rehab PT Goals Patient Stated Goal: Wife's goal: For patient to get stronger PT Goal Formulation: With family Time For Goal Achievement: 10/29/15 Potential to Achieve Goals: Fair    Frequency Min 2X/week   Barriers to discharge Decreased caregiver support Pt requiring maxA+1 for all mobiilty    Co-evaluation               End of Session Equipment Utilized During Treatment: Gait belt Activity Tolerance: Patient limited by lethargy Patient left: in bed;with call bell/phone within reach;with bed alarm set Nurse Communication: Mobility status (Concerns regarding declined cognitive status, weakness.)    Functional Assessment Tool Used: clinical judgement Functional Limitation: Mobility: Walking and moving around Mobility: Walking and Moving Around Current Status (W2993): At least 80 percent but less than 100 percent impaired, limited or restricted Mobility: Walking and Moving Around Goal Status [phone]): At least 40 percent but less than 60 percent impaired, limited or restricted    Time: 1400-1425 PT Time Calculation (min) (ACUTE ONLY): 25 min   Charges:   PT Evaluation $Initial PT Evaluation Tier I: 1 Procedure  PT G Codes:   PT G-Codes **NOT FOR INPATIENT CLASS** Functional Assessment Tool Used: clinical judgement Functional Limitation: Mobility: Walking and moving around Mobility: Walking and Moving Around Current Status (W8088): At least 80 percent but less than 100 percent impaired, limited or restricted Mobility: Walking and Moving Around Goal Status [phone]): At least 40 percent but less than 60 percent impaired, limited or restricted   Phillips Grout PT, DPT   Keison Glendinning 10/15/2015, 2:41 PM

## 2015-10-15 NOTE — Care Management Note (Signed)
Case Management Note  Patient Details  Name: Corey Hughes MRN: [id number] Date of Birth: 07-20-1941  Subjective/Objective:    Spoke with wife about discharge planning. Ms Main reports that Mr Szatkowski is too weak and that she cannot manage him at home in his current condition. PT attempted to work with Mr Galant today and he is unable to stand or walk. Mrs Shellhammer reports that this is a big change from Mr Pruitts pre-hospitalization level of functioning. Mrs Schauer would like to discuss placement options, so a Social Work Consult was made.                Action/Plan:   Expected Discharge Date:                  Expected Discharge Plan:     In-House Referral:     Discharge planning Services     Post Acute Care Choice:    Choice offered to:     DME Arranged:    DME Agency:     HH Arranged:    [place] Agency:     Status of Service:     Medicare Important Message Given:    Date Medicare IM Given:    Medicare IM give by:    Date Additional Medicare IM Given:    Additional Medicare Important Message give by:     If discussed at [place] of Stay Meetings, dates discussed:    Additional Comments:  Wyat Infinger A, RN 10/15/2015, 2:35 PM

## 2015-10-15 NOTE — Care Management Note (Signed)
Case Management Note  Patient Details  Name: Corey Hughes MRN: [id number] Date of Birth: [birth year]  Subjective/Objective:     Awaiting arrival of Corey Hughes to discuss discharge planning.                Action/Plan:   Expected Discharge Date:                  Expected Discharge Plan:     In-House Referral:     Discharge planning Services     Post Acute Care Choice:    Choice offered to:     DME Arranged:    DME Agency:     HH Arranged:    [place] Agency:     Status of Service:     Medicare Important Message Given:    Date Medicare IM Given:    Medicare IM give by:    Date Additional Medicare IM Given:    Additional Medicare Important Message give by:     If discussed at [place] of Stay Meetings, dates discussed:    Additional Comments:  Corey Hughes A, RN 10/15/2015, 12:09 PM

## 2015-10-15 NOTE — Progress Notes (Addendum)
Physical therapy spoke with dr mody about their concerns.  Dr Benjie Karvonen called me and requested that I put in an order for MRI of the brain without contrast. Discharge order cancelled

## 2015-10-15 NOTE — Discharge Summary (Signed)
[facility] NAME: Corey Hughes    MR#:  [id number]  DATE OF BIRTH:  04-Mar-1941  DATE OF ADMISSION:  10/14/2015 ADMITTING PHYSICIAN: Baxter Hire, MD  DATE OF DISCHARGE: 10/15/2015  PRIMARY CARE PHYSICIAN: Durward Fortes., MD    ADMISSION DIAGNOSIS:  Sepsis, due to unspecified organism ([place]) [A41.9]  DISCHARGE DIAGNOSIS:  Active Problems:   Sepsis ([place])   SECONDARY DIAGNOSIS:   Past Medical History  Diagnosis Date  . Chronic pain   . BPH (benign prostatic hyperplasia)   . Melanoma ([place])   . Tobacco abuse   . Pneumonia February 2013  . 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 Anemia   . Depression     HOSPITAL COURSE:  74 y/o male with history of Parkinson's disease who presented from home for weakness and questionable fever.   1. Fever: Patient has been afebrile during hospital vision. There is no source of fever. Sepsis has been ruled out with negative blood cultures, chest x-ray and urinalysis. He is on Zosyn, however given negative cultures and urinalysis and chest x-ray there is no clear indication for current broad-spectrum antibiotics.   2. Parkinson's disease patient appears to be at his baseline 3. BPH: Continue tamsulosin  4. Depression: Continue Wellbutrin   DISCHARGE CONDITIONS AND DIET:  Stable condition  Regular diet CONSULTS OBTAINED:     DRUG ALLERGIES:   Allergies  Allergen Reactions  . Morphine And Related Shortness Of Breath    DISCHARGE MEDICATIONS:   Current Discharge Medication List    START taking these medications   Details  amoxicillin-clavulanate (AUGMENTIN) 875-125 MG tablet Take 1 tablet by mouth 2 (two) times daily. Qty: 16 tablet, Refills: 0      CONTINUE these medications which have NOT CHANGED   Details  albuterol-ipratropium (COMBIVENT) 18-103 MCG/ACT inhaler Inhale 2 puffs into the lungs every 4 (four) hours as needed for  wheezing or shortness of breath. Qty: 1 Inhaler, Refills: 1    buPROPion (WELLBUTRIN SR) 150 MG 12 hr tablet Take 150 mg by mouth 2 (two) times daily.    gabapentin (NEURONTIN) 300 MG capsule Take 300 mg by mouth 5 (five) times daily.    Tamsulosin HCl (FLOMAX) 0.4 MG CAPS Take 0.4 mg by mouth daily.               Today   CHIEF COMPLAINT:  No issues afebrile   VITAL SIGNS:  Blood pressure 122/80, pulse 102, temperature 98.8 F (37.1 C), temperature source Oral, resp. rate 18, height 6' (1.829 m), weight 74.844 kg (165 lb), SpO2 98 %.   REVIEW OF SYSTEMS:  Review of Systems  Constitutional: Negative for fever, chills and malaise/fatigue.  HENT: Negative for sore throat.   Eyes: Negative for blurred vision.  Respiratory: Negative for cough, hemoptysis, shortness of breath and wheezing.   Cardiovascular: Negative for chest pain, palpitations and leg swelling.  Gastrointestinal: Negative for nausea, vomiting, abdominal pain, diarrhea and blood in stool.  Genitourinary: Negative for dysuria.  Musculoskeletal: Negative for back pain.  Neurological: Negative for dizziness, tremors and headaches.  Endo/Heme/Allergies: Does not bruise/bleed easily.  Psychiatric/Behavioral: Positive for memory loss.     PHYSICAL EXAMINATION:  GENERAL:  74 y.o.-year-old patient lying in the bed with no acute distress.  NECK:  Supple, no jugular venous distention. No thyroid enlargement, no tenderness.  LUNGS: Normal breath sounds bilaterally, no wheezing, rales,rhonchi  No  use of accessory muscles of respiration.  CARDIOVASCULAR: S1, S2 normal. No murmurs, rubs, or gallops.  ABDOMEN: Soft, non-tender, non-distended. Bowel sounds present. No organomegaly or mass.  EXTREMITIES: No pedal edema, cyanosis, or clubbing.  PSYCHIATRIC: The patient is alert and oriented x  name  SKIN: No obvious rash, lesion, or ulcer.   DATA REVIEW:   CBC  Recent Labs Lab 10/15/15 0411  WBC 11.6*  HGB  13.9  HCT 42.1  PLT 197    Chemistries   Recent Labs Lab 10/14/15 1927 10/15/15 0411  NA 134* 136  K 3.9 4.1  CL 97* 105  CO2 28 25  GLUCOSE 169* 106*  BUN 26* 23*  CREATININE 1.19 0.98  CALCIUM 8.6* 7.7*  AST 21  --   ALT 10*  --   ALKPHOS 72  --   BILITOT 0.4  --     Cardiac Enzymes  Recent Labs Lab 10/14/15 2229 10/15/15 0411  TROPONINI 0.04* 0.04*    Microbiology Results  @[email or web address] @  RADIOLOGY:  Dg Chest Portable 1 View  10/14/2015  CLINICAL DATA:  Nonresponsive. History of asthma. History of melanoma. EXAM: PORTABLE CHEST 1 VIEW COMPARISON:  July 31, 2014 FINDINGS: There is no edema or consolidation. The heart size and pulmonary vascularity are normal. No adenopathy. No bone lesions are appreciable. IMPRESSION: No edema or consolidation. Electronically Signed   By: Lowella Grip III M.D.   On: 10/14/2015 19:57      Management plans discussed with the patient's wife and she is in agreement. Stable for discharge   Patient should follow up with PCP in 1 week  CODE STATUS:     Code Status Orders        Start     Ordered   10/14/15 2249  Full code   Continuous     10/14/15 2248      TOTAL TIME TAKING CARE OF THIS PATIENT: 35 minutes.    Note: This dictation was prepared with Dragon dictation along with smaller phrase technology. Any transcriptional errors that result from this process are unintentional.  Narissa Beaufort M.D on 10/15/2015 at 12:53 PM  Between 7am to 6pm - Pager - [phone] After 6pm go to [email or web address] - password [email or web address] [facility]  Office  [phone]  CC: Primary care physician; Durward Fortes., MD

## 2015-10-15 NOTE — Progress Notes (Signed)
[place] NAME: Corey Hughes    MR#:  [id number]  DATE OF BIRTH:  18-Sep-1941  SUBJECTIVE:  Patient seems comfortable Wife not at bedside left message on her cell  REVIEW OF SYSTEMS:    Review of Systems  Constitutional: Negative for fever, chills and malaise/fatigue.  HENT: Negative for sore throat.   Eyes: Negative for blurred vision.  Respiratory: Negative for cough, hemoptysis, shortness of breath and wheezing.   Cardiovascular: Negative for chest pain, palpitations and leg swelling.  Gastrointestinal: Negative for nausea, vomiting, abdominal pain, diarrhea and blood in stool.  Genitourinary: Negative for dysuria.  Musculoskeletal: Negative for back pain.  Neurological: Negative for dizziness, tremors and headaches.  Endo/Heme/Allergies: Does not bruise/bleed easily.    Tolerating Diet:yes      DRUG ALLERGIES:   Allergies  Allergen Reactions  . Morphine And Related Shortness Of Breath    VITALS:  Blood pressure 122/80, pulse 102, temperature 98.8 F (37.1 C), temperature source Oral, resp. rate 18, height 6' (1.829 m), weight 74.844 kg (165 lb), SpO2 98 %.  PHYSICAL EXAMINATION:   Physical Exam    LABORATORY PANEL:   CBC  Recent Labs Lab 10/15/15 0411  WBC 11.6*  HGB 13.9  HCT 42.1  PLT 197   ------------------------------------------------------------------------------------------------------------------  Chemistries   Recent Labs Lab 10/14/15 1927 10/15/15 0411  NA 134* 136  K 3.9 4.1  CL 97* 105  CO2 28 25  GLUCOSE 169* 106*  BUN 26* 23*  CREATININE 1.19 0.98  CALCIUM 8.6* 7.7*  AST 21  --   ALT 10*  --   ALKPHOS 72  --   BILITOT 0.4  --    ------------------------------------------------------------------------------------------------------------------  Cardiac Enzymes  Recent Labs Lab 10/14/15 2229 10/15/15 0411  TROPONINI 0.04* 0.04*    ------------------------------------------------------------------------------------------------------------------  RADIOLOGY:  Dg Chest Portable 1 View  10/14/2015  CLINICAL DATA:  Nonresponsive. History of asthma. History of melanoma. EXAM: PORTABLE CHEST 1 VIEW COMPARISON:  July 31, 2014 FINDINGS: There is no edema or consolidation. The heart size and pulmonary vascularity are normal. No adenopathy. No bone lesions are appreciable. IMPRESSION: No edema or consolidation. Electronically Signed   By: Lowella Grip III M.D.   On: 10/14/2015 19:57     ASSESSMENT AND PLAN:   74 y/o male with history of Parkinson's disease who presented from home for weakness and questionable fever.   1. Fever: Patient has been afebrile during hospital vision. There is no source of fever. Sepsis has been ruled out with negative blood cultures, chest x-ray and urinalysis. He is on Zosyn, however given negative cultures and urinalysis and chest x-ray there is no clear indication for current broad-spectrum antibiotics.   2. Parkinson's disease patient appears to be at his baseline 3. BPH: Continue tamsulosin  4. Depression: Continue Wellbutrin  Management plans discussed with the patient and he is in agreement.  CODE STATUS: Full  TOTAL TIME TAKING CARE OF THIS PATIENT: 30 minutes.     POSSIBLE D/C today or tomorrow, DEPENDING ON CLINICAL CONDITION.   Mairi Stagliano M.D on 10/15/2015 at 11:46 AM  Between 7am to 6pm - Pager - [phone] After 6pm go to [email or web address] - password [email or web address] [facility]  Office  [phone]  CC: Primary care physician; Durward Fortes., MD  Note: This dictation was prepared with Dragon dictation along with smaller phrase technology. Any transcriptional errors that result from this process are unintentional.

## 2015-10-15 NOTE — Care Management Note (Signed)
Case Management Note  Patient Details  Name: Corey Hughes MRN: [id number] Date of Birth: 02/12/1941  Subjective/Objective:     Dr Benjie Karvonen ordered home health PT and RN services for Corey Hughes. This Probation officer left a message on Corey Hughes's phone requesting a call-back to discuss her choice of a home health provider.                Action/Plan:   Expected Discharge Date:                  Expected Discharge Plan:     In-House Referral:     Discharge planning Services     Post Acute Care Choice:    Choice offered to:     DME Arranged:    DME Agency:     HH Arranged:    [place] Agency:     Status of Service:     Medicare Important Message Given:    Date Medicare IM Given:    Medicare IM give by:    Date Additional Medicare IM Given:    Additional Medicare Important Message give by:     If discussed at [place] of Stay Meetings, dates discussed:    Additional Comments:  Corey Hughes A, RN 10/15/2015, 1:57 PM

## 2015-10-15 NOTE — Progress Notes (Signed)
ANTIBIOTIC CONSULT NOTE - INITIAL  Pharmacy Consult for Zosyn Indication: rule out sepsis  Allergies  Allergen Reactions  . Morphine And Related Shortness Of Breath    Patient Measurements: Height: 6' (182.9 cm) Weight: 165 lb (74.844 kg) IBW/kg (Calculated) : 77.6 Adjusted Body Weight: 74.8 kg  Vital Signs: Temp: 98.2 F (36.8 C) (10/28 2312) Temp Source: Oral (10/28 2312) BP: 124/75 mmHg (10/28 2312) Pulse Rate: 64 (10/28 2312) Intake/Output from previous day: 10/28 0701 - 10/29 0700 In: 2500 [I.V.:2500] Out: -  Intake/Output from this shift: Total I/O In: 2500 [I.V.:2500] Out: -   Labs:  Recent Labs  10/14/15 1927  WBC 10.8*  HGB 15.7  PLT 225  CREATININE 1.19   Estimated Creatinine Clearance: 57.6 mL/min (by C-G formula based on Cr of 1.19). No results for input(s): VANCOTROUGH, VANCOPEAK, VANCORANDOM, GENTTROUGH, GENTPEAK, GENTRANDOM, TOBRATROUGH, TOBRAPEAK, TOBRARND, AMIKACINPEAK, AMIKACINTROU, AMIKACIN in the last 72 hours.   Microbiology: No results found for this or any previous visit (from the past 720 hour(s)).  Medical History: Past Medical History  Diagnosis Date  . Chronic pain   . BPH (benign prostatic hyperplasia)   . Melanoma   . Tobacco abuse   . Pneumonia February 2013  . 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 Anemia   . Depression     Medications:  Infusions:  . sodium chloride     Assessment: 73 yom cc weakness with history of parkinsons. SOB, no cough, no N/V. Fever, tachy, mildly elevated WBC, starting Zosyn only for suspected sepsis.   Goal of Therapy:    Plan:  Zosyn 3.375 gm IV Q8H. Will continue to follow.   Laural Benes, Pharm.D.  Clinical Pharmacist 10/15/2015,12:02 AM

## 2015-10-16 DIAGNOSIS — E860 Dehydration: Secondary | ICD-10-CM | POA: Diagnosis not present

## 2015-10-16 LAB — URINE CULTURE: CULTURE: NO GROWTH

## 2015-10-16 NOTE — Care Management Important Message (Signed)
Important Message  Patient Details  Name: Corey Hughes MRN: [id number] Date of Birth: 05-22-1941   Medicare Important Message Given:  Yes-second notification given    Mardene Speak, RN 10/16/2015, 10:34 AM

## 2015-10-16 NOTE — Clinical Social Work Placement (Signed)
   CLINICAL SOCIAL WORK PLACEMENT  NOTE  Date:  10/16/2015  Patient Details  Name: Corey Hughes MRN: [id number] Date of Birth: 02/22/1941  Clinical Social Work is seeking post-discharge placement for this patient at the [place] level of care (*CSW will initial, date and re-position this form in  chart as items are completed):  Yes   Patient/family provided with [place] [facility] Department's list of facilities offering this level of care within the geographic area requested by the patient (or if unable, by the patient's family).  Yes   Patient/family informed of their freedom to choose among providers that offer the needed level of care, that participate in Medicare, Medicaid or managed care program needed by the patient, have an available bed and are willing to accept the patient.  Yes   Patient/family informed of [facility]'s ownership interest in [facility] and [facility], as well as of the fact that they are under no obligation to receive care at these facilities.  PASRR submitted to EDS on       PASRR number received on       Existing PASRR number confirmed on 10/16/15     FL2 transmitted to all facilities in geographic area requested by pt/family on 10/16/15     FL2 transmitted to all facilities within larger geographic area on       Patient informed that his/her managed care company has contracts with or will negotiate with certain facilities, including the following:        Yes   Patient/family informed of bed offers received.  Patient chooses bed at [facility]     Physician recommends and patient chooses bed at      Patient to be transferred to [facility] on 10/16/15.  Patient to be transferred to facility by EMS     Patient family notified on 10/16/15 of transfer.  Name of family member notified:  wife at bedsdie     PHYSICIAN       Additional Comment:     _______________________________________________ Alonna Buckler, LCSW 10/16/2015, 11:24 AM

## 2015-10-16 NOTE — Progress Notes (Signed)
CSW confirmed SNF bed at [facility]. Pt's chart sent electronically to facility. RN to call report and EMS for dc. Pt's wife at bedside.    Toma Copier, [place]

## 2015-10-16 NOTE — Clinical Social Work Note (Signed)
Clinical Social Work Assessment  Patient Details  Name: Corey Hughes MRN: [id number] Date of Birth: 11/18/1941  Date of referral:  10/16/15               Reason for consult:  Facility Placement                Permission sought to share information with:  Facility Sport and exercise psychologist, Family Supports Permission granted to share information::  Yes, Verbal Permission Granted  Name::        Agency::  SNFs  Relationship::  Wife  Contact Information:     Housing/Transportation Living arrangements for the past 2 months:  Single Family Home Source of Information:  Patient, Medical Team, Spouse Patient Interpreter Needed:  None Criminal Activity/Legal Involvement Pertinent to Current Situation/Hospitalization:  No - Comment as needed Significant Relationships:  Adult Children, Spouse Lives with:  Spouse Do you feel safe going back to the place where you live?  Yes Need for family participation in patient care:  Yes (Comment)  Care giving concerns:  Pt lives with his wife of 60 years who assists him around the home as needed and is the decision maker for Pt due to his memory loss.    Social Worker assessment / plan:  CSW was referred to Pt to assist with dc planning. Pt is from home with his wife, he has 2 adult children and 5 grandchildren per his report. 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 Pt's wife reports Pt was an avid Retail banker and fisherman.  Pt has declined from his baseline mobility and will benefit from SNF at dc. CSW discussed with Pt and his wife. Pt is anxious about not returning home. Pt's wife stated that he has been to SNF before and is agreeable to him going again at dc. CSW sent Pt's information out to area SNFs and Pt's wife has selected [facility] where Pt has gone before.     Employment status:  Retired Nurse, adult PT Recommendations:  [place] / Referral  to community resources:     Patient/Family's Response to care:  Pt anxious about not returning home right away, but acknowledges that he is weaker than his baseline.   Patient/Family's Understanding of and Emotional Response to Diagnosis, Current Treatment, and Prognosis: Wife is very supportive and engaged with medical team and Pt. Pt engaged with staff, participates in his care.   Emotional Assessment Appearance:  Appears stated age Attitude/Demeanor/Rapport:    Affect (typically observed):  Anxious Orientation:  Oriented to Self, Oriented to Place, Oriented to Situation Alcohol / Substance use:  Tobacco Use Psych involvement (Current and /or in the community):  No (Comment)  Discharge Needs  Concerns to be addressed:  Adjustment to Illness Readmission within the last 30 days:  No Current discharge risk:  Cognitively Impaired, Dependent with Mobility Barriers to Discharge:  Barriers Resolved   Alonna Buckler, LCSW 10/16/2015, 11:16 AM

## 2015-10-16 NOTE — Progress Notes (Signed)
[place] NAME: Corey Hughes    MR#:  [id number]  DATE OF BIRTH:  01/07/1941  SUBJECTIVE:  Confused this am  REVIEW OF SYSTEMS:    Review of Systems  Unable to perform ROS   Tolerating Diet:yes      DRUG ALLERGIES:   Allergies  Allergen Reactions  . Morphine And Related Shortness Of Breath    VITALS:  Blood pressure 129/72, pulse 78, temperature 98.1 F (36.7 C), temperature source Oral, resp. rate 18, height 6' (1.829 m), weight 74.844 kg (165 lb), SpO2 97 %.  PHYSICAL EXAMINATION:   Physical Exam  Constitutional: He is well-developed, well-nourished, and in no distress. No distress.  HENT:  Head: Normocephalic.  Eyes: No scleral icterus.  Neck: Normal range of motion. Neck supple. No JVD present. No tracheal deviation present.  Cardiovascular: Normal rate, regular rhythm and normal heart sounds.  Exam reveals no gallop and no friction rub.   No murmur heard. Pulmonary/Chest: Effort normal and breath sounds normal. No respiratory distress. He has no wheezes. He has no rales. He exhibits no tenderness.  Abdominal: Soft. Bowel sounds are normal. He exhibits no distension and no mass. There is no tenderness. There is no rebound and no guarding.  Musculoskeletal: Normal range of motion. He exhibits no edema.  Neurological: He is alert.  Skin: Skin is warm. No rash noted. No erythema.      LABORATORY PANEL:   CBC  Recent Labs Lab 10/15/15 0411  WBC 11.6*  HGB 13.9  HCT 42.1  PLT 197   ------------------------------------------------------------------------------------------------------------------  Chemistries   Recent Labs Lab 10/14/15 1927 10/15/15 0411  NA 134* 136  K 3.9 4.1  CL 97* 105  CO2 28 25  GLUCOSE 169* 106*  BUN 26* 23*  CREATININE 1.19 0.98  CALCIUM 8.6* 7.7*  AST 21  --   ALT 10*  --   ALKPHOS 72  --   BILITOT 0.4  --     ------------------------------------------------------------------------------------------------------------------  Cardiac Enzymes  Recent Labs Lab 10/14/15 2229 10/15/15 0411  TROPONINI 0.04* 0.04*   ------------------------------------------------------------------------------------------------------------------  RADIOLOGY:  Mr Brain Wo Contrast  10/15/2015  CLINICAL DATA:  Altered mental status.  Dementia.  Weakness. EXAM: MRI HEAD WITHOUT CONTRAST TECHNIQUE: Multiplanar, multiecho pulse sequences of the brain and surrounding structures were obtained without intravenous contrast. COMPARISON:  MR brain 01/29/2013.  CT head 07/29/2014. FINDINGS: The patient was unable to remain motionless for the exam. Small or subtle lesions could be overlooked. No restricted diffusion, acute or chronic hemorrhage, mass lesion, or extra-axial fluid. Severe atrophy. Disproportionate brain substance loss affecting the RIGHT greater than LEFT anterior temporal lobes could be degenerative, ischemic, or posttraumatic. Hydrocephalus ex vacuo. Extensive focal and confluent T2 and FLAIR hyperintensities, consistent with small vessel disease. Large remote LEFT basal ganglia infarct, with sequelae of either primary or secondary hemorrhage within the area of brain substance loss. Chronic RIGHT cerebellar infarcts. No other definite parenchymal hemorrhage. There is some linear ghosting near the vertex on FLAIR imaging which simulates increased signal in the sulci, but I believe is due to motion and misregistration. Normal pituitary and cerebellar tonsils. Mild pannus. No definite osseous findings. Extracranial soft tissues appear unremarkable. IMPRESSION: Severe atrophy, disproportionately involving the RIGHT anterior temporal lobe. Extensive chronic microvascular ischemic change and areas of chronic infarction. No acute intracranial findings are evident. Electronically Signed   By: Staci Righter M.D.   On: 10/15/2015  20:51   Dg  Chest Portable 1 View  10/14/2015  CLINICAL DATA:  Nonresponsive. History of asthma. History of melanoma. EXAM: PORTABLE CHEST 1 VIEW COMPARISON:  July 31, 2014 FINDINGS: There is no edema or consolidation. The heart size and pulmonary vascularity are normal. No adenopathy. No bone lesions are appreciable. IMPRESSION: No edema or consolidation. Electronically Signed   By: Lowella Grip III M.D.   On: 10/14/2015 19:57     ASSESSMENT AND PLAN:   74 y/o male with history of Parkinson's disease who presented from home for weakness and questionable fever.   1. Fever: Patient has been afebrile during hospital vision. There is no source of fever. Sepsis has been ruled out with negative blood cultures, chest x-ray and urinalysis. He is on Zosyn, however given negative cultures and urinalysis and chest x-ray there is no clear indication for current broad-spectrum antibiotics.   2. Parkinson's disease patient appears to be at his baseline 3. BPH: Continue tamsulosin  4. Depression: Continue Wellbutrin      Management plans discussed with the patient and wife and she is in agreement.  CODE STATUS: FULL  TOTAL TIME TAKING CARE OF THIS PATIENT: 30 minutes.     POSSIBLE D/C 1 day, DEPENDING ON CLINICAL CONDITION.   Corey Hughes M.D on 10/16/2015 at 11:16 AM  Between 7am to 6pm - Pager - [phone] After 6pm go to [email or web address] - password [email or web address] [facility]  Office  [phone]  CC: Primary care physician; Corey Hector, MD  Note: This dictation was prepared with Dragon dictation along with smaller phrase technology. Any transcriptional errors that result from this process are unintentional.

## 2015-10-16 NOTE — Discharge Summary (Signed)
[facility] [place] NAME: Corey Hughes    MR#:  [id number]  DATE OF BIRTH:  September 15, 1941  DATE OF ADMISSION:  10/14/2015 ADMITTING PHYSICIAN: Baxter Hire, MD  DATE OF DISCHARGE: 10/16/2015  PRIMARY CARE PHYSICIAN: Tama High III, MD    ADMISSION DIAGNOSIS:  Sepsis, due to unspecified organism ([place]) [A41.9]  DISCHARGE DIAGNOSIS:  Active Problems:   Sepsis ([place])   SECONDARY DIAGNOSIS:   Past Medical History  Diagnosis Date  . Chronic pain   . BPH (benign prostatic hyperplasia)   . Melanoma ([place])   . Tobacco abuse   . Pneumonia February 2013  . 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 Anemia   . Depression     HOSPITAL COURSE:  74 y/o male with history of Parkinson's disease who presented from home for weakness and questionable fever.   1. Fever: Patient has been afebrile during hospital vision. There is no source of fever. Sepsis has been ruled out with negative blood cultures, chest x-ray and urinalysis. He was on Zosyn, however given negative cultures and urinalysis and chest x-ray there is no clear indication for current broad-spectrum antibiotics.   2. Parkinson's disease patient appears to be at his baseline 3. BPH: Continue tamsulosin  4. Depression: Continue Wellbutrin   DISCHARGE CONDITIONS AND DIET:  Stable condition  Regular diet CONSULTS OBTAINED:     DRUG ALLERGIES:   Allergies  Allergen Reactions  . Morphine And Related Shortness Of Breath    DISCHARGE MEDICATIONS:   Current Discharge Medication List    START taking these medications   Details  amoxicillin-clavulanate (AUGMENTIN) 875-125 MG tablet Take 1 tablet by mouth 2 (two) times daily. Qty: 16 tablet, Refills: 0      CONTINUE these medications which have NOT CHANGED   Details  albuterol-ipratropium (COMBIVENT) 18-103 MCG/ACT inhaler Inhale 2 puffs into the lungs every 4 (four) hours as needed  for wheezing or shortness of breath. Qty: 1 Inhaler, Refills: 1    buPROPion (WELLBUTRIN SR) 150 MG 12 hr tablet Take 150 mg by mouth 2 (two) times daily.    gabapentin (NEURONTIN) 300 MG capsule Take 300 mg by mouth 5 (five) times daily.    Tamsulosin HCl (FLOMAX) 0.4 MG CAPS Take 0.4 mg by mouth daily.               Today   CHIEF COMPLAINT:  No issues afebrile   VITAL SIGNS:  Blood pressure 129/72, pulse 78, temperature 98.1 F (36.7 C), temperature source Oral, resp. rate 18, height 6' (1.829 m), weight 74.844 kg (165 lb), SpO2 97 %.   REVIEW OF SYSTEMS:  Review of Systems  Constitutional: Negative for fever, chills and malaise/fatigue.  HENT: Negative for sore throat.   Eyes: Negative for blurred vision.  Respiratory: Negative for cough, hemoptysis, shortness of breath and wheezing.   Cardiovascular: Negative for chest pain, palpitations and leg swelling.  Gastrointestinal: Negative for nausea, vomiting, abdominal pain, diarrhea and blood in stool.  Genitourinary: Negative for dysuria.  Musculoskeletal: Negative for back pain.  Neurological: Negative for dizziness, tremors and headaches.  Endo/Heme/Allergies: Does not bruise/bleed easily.  Psychiatric/Behavioral: Positive for memory loss.     PHYSICAL EXAMINATION:  GENERAL:  74 y.o.-year-old patient lying in the bed with no acute distress.  NECK:  Supple, no jugular venous distention. No thyroid enlargement, no tenderness.  LUNGS: Normal breath sounds bilaterally, no wheezing, rales,rhonchi  No use of accessory muscles of respiration.  CARDIOVASCULAR: S1, S2 normal. No murmurs, rubs, or gallops.  ABDOMEN: Soft, non-tender, non-distended. Bowel sounds present. No organomegaly or mass.  EXTREMITIES: No pedal edema, cyanosis, or clubbing.  PSYCHIATRIC: The patient is alert and oriented x  name  SKIN: No obvious rash, lesion, or ulcer.   DATA REVIEW:   CBC  Recent Labs Lab 10/15/15 0411  WBC 11.6*  HGB  13.9  HCT 42.1  PLT 197    Chemistries   Recent Labs Lab 10/14/15 1927 10/15/15 0411  NA 134* 136  K 3.9 4.1  CL 97* 105  CO2 28 25  GLUCOSE 169* 106*  BUN 26* 23*  CREATININE 1.19 0.98  CALCIUM 8.6* 7.7*  AST 21  --   ALT 10*  --   ALKPHOS 72  --   BILITOT 0.4  --     Cardiac Enzymes  Recent Labs Lab 10/14/15 2229 10/15/15 0411  TROPONINI 0.04* 0.04*    Microbiology Results  @[email or web address] @  RADIOLOGY:  Mr Brain Wo Contrast  10/15/2015  CLINICAL DATA:  Altered mental status.  Dementia.  Weakness. EXAM: MRI HEAD WITHOUT CONTRAST TECHNIQUE: Multiplanar, multiecho pulse sequences of the brain and surrounding structures were obtained without intravenous contrast. COMPARISON:  MR brain 01/29/2013.  CT head 07/29/2014. FINDINGS: The patient was unable to remain motionless for the exam. Small or subtle lesions could be overlooked. No restricted diffusion, acute or chronic hemorrhage, mass lesion, or extra-axial fluid. Severe atrophy. Disproportionate brain substance loss affecting the RIGHT greater than LEFT anterior temporal lobes could be degenerative, ischemic, or posttraumatic. Hydrocephalus ex vacuo. Extensive focal and confluent T2 and FLAIR hyperintensities, consistent with small vessel disease. Large remote LEFT basal ganglia infarct, with sequelae of either primary or secondary hemorrhage within the area of brain substance loss. Chronic RIGHT cerebellar infarcts. No other definite parenchymal hemorrhage. There is some linear ghosting near the vertex on FLAIR imaging which simulates increased signal in the sulci, but I believe is due to motion and misregistration. Normal pituitary and cerebellar tonsils. Mild pannus. No definite osseous findings. Extracranial soft tissues appear unremarkable. IMPRESSION: Severe atrophy, disproportionately involving the RIGHT anterior temporal lobe. Extensive chronic microvascular ischemic change and areas of chronic infarction. No acute  intracranial findings are evident. Electronically Signed   By: Staci Righter M.D.   On: 10/15/2015 20:51   Dg Chest Portable 1 View  10/14/2015  CLINICAL DATA:  Nonresponsive. History of asthma. History of melanoma. EXAM: PORTABLE CHEST 1 VIEW COMPARISON:  July 31, 2014 FINDINGS: There is no edema or consolidation. The heart size and pulmonary vascularity are normal. No adenopathy. No bone lesions are appreciable. IMPRESSION: No edema or consolidation. Electronically Signed   By: Lowella Grip III M.D.   On: 10/14/2015 19:57      Management plans discussed with the patient's wife and she is in agreement. Stable for discharge  SNF  Patient should follow up with PCP in 1 week  CODE STATUS:     Code Status Orders        Start     Ordered   10/14/15 2249  Full code   Continuous     10/14/15 2248      TOTAL TIME TAKING CARE OF THIS PATIENT: 35 minutes.    Note: This dictation was prepared with Dragon dictation along with smaller phrase technology. Any transcriptional errors that result from this process are unintentional.  Amiria Orrison M.D on 10/16/2015 at 11:15 AM  Between  7am to 6pm - Pager - [phone] After 6pm go to [email or web address] - password [email or web address] [facility]  [facility]  Office  [phone]  CC: Primary care physician; Adin Hector, MD

## 2015-10-16 NOTE — Progress Notes (Signed)
Report called to [facility] at [facility] health care center.  EMS here to transport the pt

## 2015-10-16 NOTE — NC FL2 (Signed)
[place] LEVEL OF CARE SCREENING TOOL     IDENTIFICATION  Patient Name: Corey Hughes Birthdate: 1941/03/16 Sex: male Admission Date (Current Location): 10/14/2015  [place] and [place] Number: Manufacturing engineer and Address:  [facility], [address] [place], [place] [address]      Provider Number: [id number]  Attending Physician Name and Address:  Bettey Costa, MD  Relative Name and Phone Number:       Current Level of Care: SNF Recommended Level of Care: [place] Prior Approval Number:    Date Approved/Denied:   PASRR Number: [id number]  Discharge Plan: SNF    Current Diagnoses: Patient Active Problem List   Diagnosis Date Noted  . Sepsis ([place]) 10/14/2015  . Tobacco abuse 02/03/2012  . Hypoxia 02/02/2012  . Anemia 02/02/2012  . CAP (community acquired pneumonia) 02/01/2012  . Hyponatremia 02/01/2012  . Generalized weakness 02/01/2012  . Memory loss, short term 02/01/2012       Parkinsons Disease   Orientation ACTIVITIES/SOCIAL BLADDER RESPIRATION    Self, Situation, Place  Family supportive Incontinent O2 (As needed) 2.5l  BEHAVIORAL SYMPTOMS/MOOD NEUROLOGICAL BOWEL NUTRITION STATUS      Incontinent Diet (regular)  PHYSICIAN VISITS COMMUNICATION OF NEEDS Height & Weight Skin  30 days Verbally 6' (182.9 cm) 165 lbs. Normal          AMBULATORY STATUS RESPIRATION    Assist extensive O2 (As needed)      Personal Care Assistance Level of Assistance  Bathing, Dressing Bathing Assistance: Limited assistance   Dressing Assistance: Limited assistance      Functional Limitations Info  Hearing   Hearing Info: Impaired         SPECIAL CARE FACTORS FREQUENCY  PT (By licensed PT), OT (By licensed OT)     PT Frequency: 5 OT Frequency: 5           Additional Factors Info  Allergies   Allergies Info: Morphine and related           Current Medications (10/16/2015): Current  Facility-Administered Medications  Medication Dose Route Frequency Provider Last Rate Last Dose  . 0.9 %  sodium chloride infusion   Intravenous Continuous Baxter Hire, MD 150 mL/hr at 10/16/15 0710    . acetaminophen (TYLENOL) tablet 650 mg  650 mg Oral Q6H PRN Baxter Hire, MD       Or  . acetaminophen (TYLENOL) suppository 650 mg  650 mg Rectal Q6H PRN Baxter Hire, MD      . buPROPion [facility] SR) 12 hr tablet 150 mg  150 mg Oral BID Baxter Hire, MD   150 mg at 10/16/15 1004  . gabapentin (NEURONTIN) capsule 300 mg  300 mg Oral TID Baxter Hire, MD   300 mg at 10/16/15 1004  . heparin injection 5,000 Units  5,000 Units Subcutaneous 3 times per day Baxter Hire, MD   5,000 Units at 10/16/15 [phone]  . ipratropium-albuterol (DUONEB) 0.5-2.5 (3) MG/3ML nebulizer solution 3 mL  3 mL Inhalation Q4H PRN Baxter Hire, MD      . piperacillin-tazobactam (ZOSYN) IVPB 3.375 g  3.375 g Intravenous 3 times per day Baxter Hire, MD   3.375 g at 10/16/15 1937   Do not use this list as official medication orders. Please verify with discharge summary.  Discharge Medications:   Medication List    TAKE these medications        albuterol-ipratropium 18-103 MCG/ACT  inhaler  Commonly known as:  COMBIVENT  Inhale 2 puffs into the lungs every 4 (four) hours as needed for wheezing or shortness of breath.     amoxicillin-clavulanate 875-125 MG tablet  Commonly known as:  AUGMENTIN  Take 1 tablet by mouth 2 (two) times daily.     buPROPion 150 MG 12 hr tablet  Commonly known as:  WELLBUTRIN SR  Take 150 mg by mouth 2 (two) times daily.     gabapentin 300 MG capsule  Commonly known as:  NEURONTIN  Take 300 mg by mouth 5 (five) times daily.     tamsulosin 0.4 MG Caps capsule  Commonly known as:  FLOMAX  Take 0.4 mg by mouth daily.        Relevant Imaging Results:  Relevant Lab Results:  Recent Labs    Additional [place], LCSW

## 2015-10-19 LAB — CULTURE, BLOOD (ROUTINE X 2)
CULTURE: NO GROWTH
CULTURE: NO GROWTH

## 2015-11-17 ENCOUNTER — Emergency Department: Payer: Medicare Other

## 2015-11-17 ENCOUNTER — Encounter: Payer: Self-pay | Admitting: *Deleted

## 2015-11-17 ENCOUNTER — Inpatient Hospital Stay
Admission: EM | Admit: 2015-11-17 | Discharge: 2015-11-23 | DRG: 871 | Disposition: A | Discharge status: Home / Self Care | Payer: Medicare Other | Attending: Internal Medicine | Admitting: Internal Medicine

## 2015-11-17 DIAGNOSIS — J189 Pneumonia, unspecified organism: Secondary | ICD-10-CM | POA: Diagnosis not present

## 2015-11-17 DIAGNOSIS — A419 Sepsis, unspecified organism: Secondary | ICD-10-CM | POA: Diagnosis not present

## 2015-11-17 DIAGNOSIS — F329 Major depressive disorder, single episode, unspecified: Secondary | ICD-10-CM | POA: Diagnosis present

## 2015-11-17 DIAGNOSIS — I471 Supraventricular tachycardia: Secondary | ICD-10-CM | POA: Diagnosis present

## 2015-11-17 DIAGNOSIS — F0280 Dementia in other diseases classified elsewhere without behavioral disturbance: Secondary | ICD-10-CM | POA: Diagnosis present

## 2015-11-17 DIAGNOSIS — I69351 Hemiplegia and hemiparesis following cerebral infarction affecting right dominant side: Secondary | ICD-10-CM | POA: Diagnosis not present

## 2015-11-17 DIAGNOSIS — I248 Other forms of acute ischemic heart disease: Secondary | ICD-10-CM | POA: Diagnosis present

## 2015-11-17 DIAGNOSIS — Z8711 Personal history of peptic ulcer disease: Secondary | ICD-10-CM

## 2015-11-17 DIAGNOSIS — R112 Nausea with vomiting, unspecified: Secondary | ICD-10-CM

## 2015-11-17 DIAGNOSIS — G9341 Metabolic encephalopathy: Secondary | ICD-10-CM | POA: Diagnosis present

## 2015-11-17 DIAGNOSIS — Z66 Do not resuscitate: Secondary | ICD-10-CM | POA: Diagnosis present

## 2015-11-17 DIAGNOSIS — G20 Parkinson's disease: Secondary | ICD-10-CM | POA: Diagnosis present

## 2015-11-17 DIAGNOSIS — F17200 Nicotine dependence, unspecified, uncomplicated: Secondary | ICD-10-CM | POA: Diagnosis present

## 2015-11-17 DIAGNOSIS — K567 Ileus, unspecified: Secondary | ICD-10-CM

## 2015-11-17 DIAGNOSIS — I739 Peripheral vascular disease, unspecified: Secondary | ICD-10-CM | POA: Diagnosis present

## 2015-11-17 DIAGNOSIS — D649 Anemia, unspecified: Secondary | ICD-10-CM | POA: Diagnosis present

## 2015-11-17 DIAGNOSIS — R1310 Dysphagia, unspecified: Secondary | ICD-10-CM | POA: Diagnosis present

## 2015-11-17 DIAGNOSIS — D473 Essential (hemorrhagic) thrombocythemia: Secondary | ICD-10-CM | POA: Diagnosis present

## 2015-11-17 DIAGNOSIS — I4719 Other supraventricular tachycardia: Secondary | ICD-10-CM

## 2015-11-17 DIAGNOSIS — Z8249 Family history of ischemic heart disease and other diseases of the circulatory system: Secondary | ICD-10-CM

## 2015-11-17 DIAGNOSIS — J449 Chronic obstructive pulmonary disease, unspecified: Secondary | ICD-10-CM | POA: Diagnosis present

## 2015-11-17 DIAGNOSIS — J690 Pneumonitis due to inhalation of food and vomit: Secondary | ICD-10-CM | POA: Diagnosis present

## 2015-11-17 DIAGNOSIS — E785 Hyperlipidemia, unspecified: Secondary | ICD-10-CM | POA: Diagnosis present

## 2015-11-17 DIAGNOSIS — N400 Enlarged prostate without lower urinary tract symptoms: Secondary | ICD-10-CM | POA: Diagnosis present

## 2015-11-17 DIAGNOSIS — Z823 Family history of stroke: Secondary | ICD-10-CM

## 2015-11-17 DIAGNOSIS — Z515 Encounter for palliative care: Secondary | ICD-10-CM | POA: Diagnosis present

## 2015-11-17 DIAGNOSIS — Z8701 Personal history of pneumonia (recurrent): Secondary | ICD-10-CM | POA: Diagnosis not present

## 2015-11-17 DIAGNOSIS — J9601 Acute respiratory failure with hypoxia: Secondary | ICD-10-CM | POA: Diagnosis present

## 2015-11-17 DIAGNOSIS — I10 Essential (primary) hypertension: Secondary | ICD-10-CM | POA: Diagnosis present

## 2015-11-17 DIAGNOSIS — I4891 Unspecified atrial fibrillation: Secondary | ICD-10-CM | POA: Diagnosis present

## 2015-11-17 DIAGNOSIS — Z809 Family history of malignant neoplasm, unspecified: Secondary | ICD-10-CM

## 2015-11-17 DIAGNOSIS — E871 Hypo-osmolality and hyponatremia: Secondary | ICD-10-CM | POA: Diagnosis present

## 2015-11-17 DIAGNOSIS — Z85820 Personal history of malignant melanoma of skin: Secondary | ICD-10-CM | POA: Diagnosis not present

## 2015-11-17 DIAGNOSIS — E440 Moderate protein-calorie malnutrition: Secondary | ICD-10-CM | POA: Diagnosis present

## 2015-11-17 DIAGNOSIS — D72829 Elevated white blood cell count, unspecified: Secondary | ICD-10-CM

## 2015-11-17 DIAGNOSIS — J181 Lobar pneumonia, unspecified organism: Secondary | ICD-10-CM

## 2015-11-17 DIAGNOSIS — R778 Other specified abnormalities of plasma proteins: Secondary | ICD-10-CM

## 2015-11-17 DIAGNOSIS — R7989 Other specified abnormal findings of blood chemistry: Secondary | ICD-10-CM

## 2015-11-17 DIAGNOSIS — I878 Other specified disorders of veins: Secondary | ICD-10-CM | POA: Diagnosis present

## 2015-11-17 HISTORY — DX: Supraventricular tachycardia: I47.1

## 2015-11-17 LAB — BLOOD GAS, VENOUS
Acid-Base Excess: 5.3 mmol/L — ABNORMAL HIGH (ref 0.0–3.0)
Bicarbonate: 31.1 mEq/L — ABNORMAL HIGH (ref 21.0–28.0)
FIO2: 0.28
PCO2 VEN: 49 mmHg (ref 44.0–60.0)
PH VEN: 7.41 (ref 7.320–7.430)
Patient temperature: 37

## 2015-11-17 LAB — MRSA PCR SCREENING: MRSA by PCR: NEGATIVE

## 2015-11-17 LAB — URINALYSIS COMPLETE WITH MICROSCOPIC (ARMC ONLY)
BILIRUBIN URINE: NEGATIVE
Bacteria, UA: NONE SEEN
GLUCOSE, UA: NEGATIVE mg/dL
KETONES UR: NEGATIVE mg/dL
Leukocytes, UA: NEGATIVE
Nitrite: NEGATIVE
Protein, ur: 100 mg/dL — AB
Specific Gravity, Urine: 1.029 (ref 1.005–1.030)
pH: 6 (ref 5.0–8.0)

## 2015-11-17 LAB — CBC WITH DIFFERENTIAL/PLATELET
BASOS ABS: 0.1 10*3/uL (ref 0–0.1)
BASOS PCT: 0 %
EOS ABS: 0 10*3/uL (ref 0–0.7)
Eosinophils Relative: 0 %
HCT: 44.1 % (ref 40.0–52.0)
HEMOGLOBIN: 14.3 g/dL (ref 13.0–18.0)
Lymphocytes Relative: 5 %
Lymphs Abs: 0.8 10*3/uL — ABNORMAL LOW (ref 1.0–3.6)
MCH: 33.2 pg (ref 26.0–34.0)
MCHC: 32.4 g/dL (ref 32.0–36.0)
MCV: 102.7 fL — ABNORMAL HIGH (ref 80.0–100.0)
MONOS PCT: 10 %
Monocytes Absolute: 1.7 10*3/uL — ABNORMAL HIGH (ref 0.2–1.0)
Neutro Abs: 15.1 10*3/uL — ABNORMAL HIGH (ref 1.4–6.5)
Neutrophils Relative %: 85 %
Platelets: 466 10*3/uL — ABNORMAL HIGH (ref 150–440)
RBC: 4.3 MIL/uL — ABNORMAL LOW (ref 4.40–5.90)
RDW: 18.5 % — ABNORMAL HIGH (ref 11.5–14.5)
WBC: 17.8 10*3/uL — AB (ref 3.8–10.6)

## 2015-11-17 LAB — COMPREHENSIVE METABOLIC PANEL
ALBUMIN: 2.4 g/dL — AB (ref 3.5–5.0)
ALK PHOS: 135 U/L — AB (ref 38–126)
ALT: 21 U/L (ref 17–63)
ANION GAP: 10 (ref 5–15)
AST: 34 U/L (ref 15–41)
BILIRUBIN TOTAL: 0.7 mg/dL (ref 0.3–1.2)
BUN: 32 mg/dL — ABNORMAL HIGH (ref 6–20)
CALCIUM: 8.7 mg/dL — AB (ref 8.9–10.3)
CO2: 28 mmol/L (ref 22–32)
Chloride: 95 mmol/L — ABNORMAL LOW (ref 101–111)
Creatinine, Ser: 1.04 mg/dL (ref 0.61–1.24)
Glucose, Bld: 111 mg/dL — ABNORMAL HIGH (ref 65–99)
Potassium: 4.2 mmol/L (ref 3.5–5.1)
SODIUM: 133 mmol/L — AB (ref 135–145)
TOTAL PROTEIN: 7.1 g/dL (ref 6.5–8.1)

## 2015-11-17 LAB — TROPONIN I
Troponin I: 0.19 ng/mL — ABNORMAL HIGH (ref <0.031)
Troponin I: 0.04 ng/mL — ABNORMAL HIGH (ref <0.031)
Troponin I: 0.03 ng/mL (ref <0.031)

## 2015-11-17 LAB — HEPARIN LEVEL (UNFRACTIONATED)

## 2015-11-17 LAB — APTT: aPTT: 44 seconds — ABNORMAL HIGH (ref 24–36)

## 2015-11-17 LAB — PROTIME-INR
INR: 1.2
PROTHROMBIN TIME: 15.4 s — AB (ref 11.4–15.0)

## 2015-11-17 LAB — LACTIC ACID, PLASMA: LACTIC ACID, VENOUS: 2.4 mmol/L — AB (ref 0.5–2.0)

## 2015-11-17 MED ORDER — PIPERACILLIN-TAZOBACTAM 3.375 G IVPB
3.3750 g | Freq: Three times a day (TID) | INTRAVENOUS | Status: DC
  Administered 2015-11-17 – 2015-11-20 (×8): 3.375 g via INTRAVENOUS
  Filled 2015-11-17 (×13): qty 50

## 2015-11-17 MED ORDER — VANCOMYCIN HCL IN DEXTROSE 1-5 GM/200ML-% IV SOLN
1000.0000 mg | Freq: Once | INTRAVENOUS | Status: AC
  Administered 2015-11-17: 1000 mg via INTRAVENOUS

## 2015-11-17 MED ORDER — SODIUM CHLORIDE 0.9 % IV BOLUS (SEPSIS)
2000.0000 mL | Freq: Once | INTRAVENOUS | Status: AC
  Administered 2015-11-17: 2000 mL via INTRAVENOUS

## 2015-11-17 MED ORDER — VANCOMYCIN HCL IN DEXTROSE 750-5 MG/150ML-% IV SOLN
750.0000 mg | INTRAVENOUS | Status: DC
  Administered 2015-11-18 – 2015-11-19 (×2): 750 mg via INTRAVENOUS
  Filled 2015-11-17 (×4): qty 150

## 2015-11-17 MED ORDER — DILTIAZEM HCL 25 MG/5ML IV SOLN
10.0000 mg | Freq: Once | INTRAVENOUS | Status: AC
  Administered 2015-11-17: 10 mg via INTRAVENOUS
  Filled 2015-11-17: qty 5

## 2015-11-17 MED ORDER — DILTIAZEM HCL 100 MG IV SOLR
5.0000 mg/h | INTRAVENOUS | Status: DC
  Administered 2015-11-17 – 2015-11-18 (×2): 10 mg/h via INTRAVENOUS
  Filled 2015-11-17: qty 100

## 2015-11-17 MED ORDER — ASPIRIN 325 MG PO TABS
325.0000 mg | ORAL_TABLET | Freq: Once | ORAL | Status: AC
  Administered 2015-11-17: 325 mg via ORAL
  Filled 2015-11-17: qty 1

## 2015-11-17 MED ORDER — DILTIAZEM HCL 30 MG PO TABS
30.0000 mg | ORAL_TABLET | Freq: Four times a day (QID) | ORAL | Status: DC
  Administered 2015-11-18 – 2015-11-23 (×19): 30 mg via ORAL
  Filled 2015-11-17 (×19): qty 1

## 2015-11-17 MED ORDER — DEXTROSE-NACL 5-0.45 % IV SOLN
INTRAVENOUS | Status: DC
  Administered 2015-11-17 – 2015-11-18 (×4): via INTRAVENOUS

## 2015-11-17 MED ORDER — PANTOPRAZOLE SODIUM 40 MG IV SOLR
40.0000 mg | INTRAVENOUS | Status: DC
  Administered 2015-11-17 – 2015-11-19 (×3): 40 mg via INTRAVENOUS
  Filled 2015-11-17 (×3): qty 40

## 2015-11-17 MED ORDER — ONDANSETRON HCL 4 MG PO TABS: 4.0000 mg | ORAL_TABLET | Freq: Four times a day (QID) | ORAL | Status: DC | PRN

## 2015-11-17 MED ORDER — IPRATROPIUM-ALBUTEROL 18-103 MCG/ACT IN AERO: 2.0000 | INHALATION_SPRAY | RESPIRATORY_TRACT | Status: DC | PRN

## 2015-11-17 MED ORDER — DEXTROSE 5 % IV SOLN
5.0000 mg/h | Freq: Once | INTRAVENOUS | Status: AC
  Administered 2015-11-17: 5 mg/h via INTRAVENOUS
  Filled 2015-11-17: qty 100

## 2015-11-17 MED ORDER — DILTIAZEM HCL 100 MG IV SOLR
5.0000 mg/h | Freq: Once | INTRAVENOUS | Status: DC
  Filled 2015-11-17: qty 100

## 2015-11-17 MED ORDER — CETYLPYRIDINIUM CHLORIDE 0.05 % MT LIQD
7.0000 mL | Freq: Two times a day (BID) | OROMUCOSAL | Status: DC
  Administered 2015-11-17 – 2015-11-20 (×6): 7 mL via OROMUCOSAL

## 2015-11-17 MED ORDER — ACETAMINOPHEN 650 MG RE SUPP
650.0000 mg | Freq: Once | RECTAL | Status: AC
  Administered 2015-11-17: 650 mg via RECTAL
  Filled 2015-11-17: qty 1

## 2015-11-17 MED ORDER — HEPARIN BOLUS VIA INFUSION
2800.0000 [IU] | Freq: Once | INTRAVENOUS | Status: AC
  Administered 2015-11-17: 2800 [IU] via INTRAVENOUS
  Filled 2015-11-17: qty 2800

## 2015-11-17 MED ORDER — HEPARIN (PORCINE) IN NACL 100-0.45 UNIT/ML-% IJ SOLN
800.0000 [IU]/h | INTRAMUSCULAR | Status: DC
  Administered 2015-11-17: 800 [IU]/h via INTRAVENOUS
  Filled 2015-11-17: qty 250

## 2015-11-17 MED ORDER — ASPIRIN 300 MG RE SUPP
300.0000 mg | Freq: Every day | RECTAL | Status: DC
  Administered 2015-11-17 – 2015-11-18 (×2): 300 mg via RECTAL
  Filled 2015-11-17 (×2): qty 1

## 2015-11-17 MED ORDER — ONDANSETRON HCL 4 MG/2ML IJ SOLN
4.0000 mg | Freq: Once | INTRAMUSCULAR | Status: AC
  Administered 2015-11-17: 4 mg via INTRAVENOUS
  Filled 2015-11-17: qty 2

## 2015-11-17 MED ORDER — SODIUM CHLORIDE 0.9 % IJ SOLN
3.0000 mL | Freq: Two times a day (BID) | INTRAMUSCULAR | Status: DC
  Administered 2015-11-17 – 2015-11-20 (×7): 3 mL via INTRAVENOUS

## 2015-11-17 MED ORDER — IPRATROPIUM-ALBUTEROL 0.5-2.5 (3) MG/3ML IN SOLN
3.0000 mL | RESPIRATORY_TRACT | Status: DC
  Administered 2015-11-17 – 2015-11-18 (×5): 3 mL via RESPIRATORY_TRACT
  Filled 2015-11-17 (×5): qty 3

## 2015-11-17 MED ORDER — VANCOMYCIN HCL IN DEXTROSE 750-5 MG/150ML-% IV SOLN
750.0000 mg | Freq: Once | INTRAVENOUS | Status: AC
  Administered 2015-11-18: 750 mg via INTRAVENOUS
  Filled 2015-11-17 (×2): qty 150

## 2015-11-17 MED ORDER — PIPERACILLIN-TAZOBACTAM 3.375 G IVPB 30 MIN
3.3750 g | Freq: Once | INTRAVENOUS | Status: AC
  Administered 2015-11-17: 3.375 g via INTRAVENOUS

## 2015-11-17 MED ORDER — ONDANSETRON HCL 4 MG/2ML IJ SOLN: 4.0000 mg | Freq: Four times a day (QID) | INTRAMUSCULAR | Status: DC | PRN

## 2015-11-17 NOTE — Progress Notes (Deleted)
[facility] [place] NAME: Corey Hughes    MR#:  [id number]  DATE OF BIRTH:  1941-09-06  DATE OF ADMISSION:  11/17/2015  PRIMARY CARE PHYSICIAN: Tama High III, MD   REQUESTING/REFERRING PHYSICIAN:   CHIEF COMPLAINT:   Chief Complaint  Patient presents with  . Dysuria  . Fever    HISTORY OF PRESENT ILLNESS: Corey Hughes  is a 74 y.o. male with a known history of recent admission for pneumonia, history of stroke with right-sided weakness, COPD, Parkinson's dementia who presents to the hospital with nausea, vomiting, choking, as well as strangling on food. The patient's wife as patient is not able to provide any history due to dementia. Patient has been having problems with choking strangling on food regurgitating have having nausea and vomiting for the past week. On arrival to emergency room, he was noted to be in atrial fibrillation, RVR, rate of 150. His labs revealed hyponatremia, hypoalbuminemia, elevated troponin to 0.19. Elevated white blood cell count is 17.8 and elevated lactic acid level to 2.4. Radiologic studies revealed a right hilar opacity concerning for pneumonia and mild ileus. Patient was given bolus of Cardizem with no significant improvement of his A. fib, RVR, so he was started on Cardizem IV drip. Patient denies any pain.  Hospitalist services were contacted for admission  PAST MEDICAL HISTORY:   Past Medical History  Diagnosis Date  . Chronic pain   . BPH (benign prostatic hyperplasia)   . Melanoma ([place])   . Tobacco abuse   . Pneumonia February 2013  . 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 Anemia   . Depression     PAST SURGICAL HISTORY:  Past Surgical History  Procedure Laterality Date  . Skin surgery      for melanoma    SOCIAL HISTORY:  Social History  Substance Use Topics  . Smoking status: Current Every Day Smoker -- 2.00 packs/day for 50 years  . Smokeless  tobacco: Not on file  . Alcohol Use: Yes     Comment: occasional    FAMILY HISTORY: No family history on file.  DRUG ALLERGIES:  Allergies  Allergen Reactions  . Morphine And Related Shortness Of Breath    Review of Systems  Unable to perform ROS: dementia    MEDICATIONS AT HOME:  Prior to Admission medications   Medication Sig Start Date End Date Taking? Authorizing Provider  aspirin EC 81 MG tablet Take 81 mg by mouth daily.   Yes Historical Provider, MD  buPROPion (WELLBUTRIN SR) 150 MG 12 hr tablet Take 150 mg by mouth 2 (two) times daily.   Yes Historical Provider, MD  carbidopa-levodopa (SINEMET IR) 25-100 MG tablet Take 1 tablet by mouth 3 (three) times daily. 09/16/15  Yes Historical Provider, MD  Cyanocobalamin (RA VITAMIN B-12 TR) 1000 MCG TBCR Take 1,000 mcg by mouth daily.   Yes Historical Provider, MD  donepezil (ARICEPT) 5 MG tablet Take 5 mg by mouth at bedtime. 09/25/15  Yes Historical Provider, MD  gabapentin (NEURONTIN) 300 MG capsule Take 300 mg by mouth 5 (five) times daily.   Yes Historical Provider, MD  hydrOXYzine (ATARAX/VISTARIL) 10 MG tablet Take 10 mg by mouth 3 (three) times daily as needed. For itching. 07/26/15  Yes Historical Provider, MD  ipratropium-albuterol (DUONEB) 0.5-2.5 (3) MG/3ML SOLN Take 3 mLs by nebulization every 6 (six) hours as needed. For asthma. 09/16/15  Yes Historical  Provider, MD  mirtazapine (REMERON) 15 MG tablet Take 15 mg by mouth at bedtime. 09/27/15  Yes Historical Provider, MD  Tamsulosin HCl (FLOMAX) 0.4 MG CAPS Take 0.4 mg by mouth 2 (two) times daily.    Yes Historical Provider, MD  amoxicillin-clavulanate (AUGMENTIN) 875-125 MG tablet Take 1 tablet by mouth 2 (two) times daily. 10/15/15   Bettey Costa, MD      PHYSICAL EXAMINATION:   VITAL SIGNS: Blood pressure 131/86, pulse 75, temperature 97.8 F (36.6 C), temperature source Oral, resp. rate 14, height 5\' 8"  (1.727 m), weight 55.792 kg (123 lb), SpO2 95 %.  GENERAL:  74  y.o.-year-old patient lying in the bed with no acute distress.  EYES: Pupils equal, round, reactive to light and accommodation. No scleral icterus. Extraocular muscles intact.  HEENT: Head atraumatic, normocephalic. Oropharynx and nasopharynx clear.  NECK:  Supple, no jugular venous distention. No thyroid enlargement, no tenderness.  LUNGS: Diminished breath sounds on the right, few crackles on the left, better air entrance on the left, no wheezing, few rales,rhonchi and crepitations noted bilaterally. No use of accessory muscles of respiration.  CARDIOVASCULAR: S1, S2 , tachycardic, distant. No murmurs, rubs, or gallops.  ABDOMEN: Soft, nontender, nondistended. Bowel sounds present. No organomegaly or mass.  EXTREMITIES: No pedal edema, cyanosis, or clubbing.  NEUROLOGIC: Cranial nerves II through XII are intact. Muscle strength 5/5 in all extremities. Sensation intact. Gait not checked.  PSYCHIATRIC: The patient is alert but not oriented.  SKIN: Crusted rash,  lesions, ulcerations noted in bilateral toes, very painful to palpation. Peripheral pulses are 1+.   LABORATORY PANEL:   CBC  Recent Labs Lab 11/17/15 0852  WBC 17.8*  HGB 14.3  HCT 44.1  PLT 466*  MCV 102.7*  MCH 33.2  MCHC 32.4  RDW 18.5*  LYMPHSABS 0.8*  MONOABS 1.7*  EOSABS 0.0  BASOSABS 0.1   ------------------------------------------------------------------------------------------------------------------  Chemistries   Recent Labs Lab 11/17/15 0852  NA 133*  K 4.2  CL 95*  CO2 28  GLUCOSE 111*  BUN 32*  CREATININE 1.04  CALCIUM 8.7*  AST 34  ALT 21  ALKPHOS 135*  BILITOT 0.7   ------------------------------------------------------------------------------------------------------------------  Cardiac Enzymes  Recent Labs Lab 11/17/15 0852  TROPONINI 0.19*   ------------------------------------------------------------------------------------------------------------------  RADIOLOGY: Dg Abd  Acute W/chest  11/17/2015  CLINICAL DATA:  Abdominal pain.  Foul-smelling urine. EXAM: DG ABDOMEN ACUTE W/ 1V CHEST COMPARISON:  Chest x-ray 10/14/2015 FINDINGS: The upright chest x-ray demonstrates a right hilar opacity. This was not present on the prior chest film and is on likely a mass. It is most likely a perihilar pneumonia. No pleural effusion. The cardiac silhouette, mediastinal and hilar contours are unremarkable. Prominent skin fold over the left chest but no definite pneumothorax. A left lower lobe calcified granuloma is noted. Two views of the abdomen demonstrate moderate air scattered throughout the small bowel and colon which could suggest a mild diffuse ileus or gastroenteritis. No findings for small bowel obstruction or free air. The soft tissue shadows are maintained. Vascular calcifications are noted. The bony structures are intact. IMPRESSION: 1. Right hilar opacity, most likely pneumonia. Recommend post treatment follow-up chest x-ray in 3-4 weeks to make sure this resolves. 2. Possible mild ileus or gastroenteritis. No findings for obstruction or perforation. Electronically Signed   By: Marijo Sanes M.D.   On: 11/17/2015 09:34    EKG: Orders placed or performed during the hospital encounter of 11/17/15  . EKG 12-Lead  . EKG 12-Lead  .  EKG 12-Lead  . EKG 12-Lead    IMPRESSION AND PLAN:  Active Problems:   Sepsis ([place])   Right lower lobe pneumonia   Atrial fibrillation with RVR (HCC)   Elevated troponin   Dysphagia   Nausea & vomiting   Ileus ([facility]) 1. Sepsis due to pneumonia, admitted patient medical floor. Blood cultures are taken. In emergency room, initiate patient on broad-spectrum antibiotics due to recent hospitalization 2. Right-sided pneumonia, suspicious for aspiration, get speech therapist evaluation, keep patient nothing by mouth, continue patient on Zosyn for now, get sputum cultures if possible 3. A. fib, RVR, continue patient on Cardizem IV drip changed to  oral Cardizem whenever able to take orally, continue IV heparin 4. Elevated troponin. Possibly related to tachycardia, get cardiologist involved for further recommendations. Continue heparin for now 5. Dysphagia. Speech therapist evaluation  6. Nausea and vomiting. Supportive therapy, IV fluids, nothing by mouth since it is concerning for ileus 7 Ileus, nothing by mouth, get KUB in the morning. IV fluids, get NG tube placed if recurrent nausea and vomiting , PPI IV     All the records are reviewed and case discussed with ED provider. Management plans discussed with the patient, family and they are in agreement.  CODE STATUS:    TOTAL  CRITICAL CARETIME TAKING CARE OF THIS PATIENT: 65 minutes .    Theodoro Grist M.D on 11/17/2015 at 10:53 AM  Between 7am to 6pm - Pager - [phone] After 6pm go to [email or web address] - password [email or web address] [facility]  [facility]  Office  [phone]  CC: Primary care physician; Adin Hector, MD

## 2015-11-17 NOTE — Progress Notes (Signed)
ANTIBIOTIC CONSULT NOTE - INITIAL  Pharmacy Consult for Vancomycin/Zosyn Indication: Sepsis/PNA/?HCAP  Allergies  Allergen Reactions  . Morphine And Related Shortness Of Breath    Patient Measurements: Height: 5\' 8"  (172.7 cm) Weight: 123 lb (55.792 kg) IBW/kg (Calculated) : 68.4 Adjusted Body Weight:   Vital Signs: Temp: 97.8 F (36.6 C) (12/01 0844) Temp Source: Oral (12/01 0844) BP: 131/86 mmHg (12/01 1100) Pulse Rate: 75 (12/01 1030) Intake/Output from previous day:   Intake/Output from this shift:    Labs:  Recent Labs  11/17/15 0852  WBC 17.8*  HGB 14.3  PLT 466*  CREATININE 1.04   Estimated Creatinine Clearance: 48.4 mL/min (by C-G formula based on Cr of 1.04).    Microbiology: No results found for this or any previous visit (from the past 720 hour(s)).  Medical History: Past Medical History  Diagnosis Date  . Chronic pain   . BPH (benign prostatic hyperplasia)   . Melanoma ([place])   . Tobacco abuse   . Pneumonia February 2013  . 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 Anemia   . Depression     Medications:  Scheduled:  . pantoprazole (PROTONIX) IV  40 mg Intravenous Q24H  . [START ON 11/18/2015] Vancomycin  750 mg Intravenous Once  . [START ON 11/18/2015] Vancomycin  750 mg Intravenous Q18H   Anti-infectives    Start     Dose/Rate Route Frequency Ordered Stop   11/18/15 2000  vancomycin (VANCOCIN) IVPB 750 mg/150 ml premix     750 mg 150 mL/hr over 60 Minutes Intravenous Every 18 hours 11/17/15 1157     11/18/15 0100  vancomycin (VANCOCIN) IVPB 750 mg/150 ml premix     750 mg 150 mL/hr over 60 Minutes Intravenous  Once 11/17/15 1157     11/17/15 1800  piperacillin-tazobactam (ZOSYN) IVPB 3.375 g     3.375 g 12.5 mL/hr over 240 Minutes Intravenous Every 8 hours 11/17/15 1157     11/17/15 1030  piperacillin-tazobactam (ZOSYN) IVPB 3.375 g     3.375 g 100 mL/hr over 30 Minutes Intravenous  Once 11/17/15 1026  11/17/15 1059   11/17/15 1030  vancomycin (VANCOCIN) IVPB 1000 mg/200 mL premix     1,000 mg 200 mL/hr over 60 Minutes Intravenous  Once 11/17/15 1026 11/17/15 1150     Assessment: 74 yo Male with sepsis from PNA-?HCAP, ?aspiration. Patient with hospitalization 2 months ago for Sepsis. Hx: Parkinsons, Recent PNA,COPD  Ke 0.045  T1/2 15.4  Vd 39  Goal of Therapy:  Vancomycin trough level 15-20 mcg/ml  Plan:  Measure antibiotic drug levels at steady state Follow up culture results   Patient received Zosyn 3.375gm and Vancomycin 1 gram in ER. Will continue with EI Zosyn 3.375gm IV q8h. Will give 2nd dose of Vancomycin 750mg  ~13 hrs after 1st dose for stacked dosing. Will then continue with Vancomycin 750mg  IV q18h. Will order trough prior to 5th dose 12/4 at 0730.  Chinita Greenland PharmD Clinical Pharmacist 11/17/2015 12:07 PM

## 2015-11-17 NOTE — Progress Notes (Signed)
Paged Dr. Fletcher Anon regarding pt not being able to swallow oral cardizem due to risk of aspiration. No call back received.

## 2015-11-17 NOTE — ED Notes (Addendum)
Wife reports foul smelling urine, pt is nonverbal, pt is not taking in anything PO

## 2015-11-17 NOTE — Consult Note (Signed)
Cardiology Consultation Note  Patient ID: Corey [facility], MRN: [id number], DOB/AGE: 75/27/1942 73 y.o. Admit date: 11/17/2015   Date of Consult: 11/17/2015 Primary Physician: [facility] Primary Cardiologist: [facility], previously [facility]  Chief Complaint: Dysuria, fever Reason for Consult: ? Afib/sinus tachycardia   HPI: 74 y.o. male with h/o stroke with residual right-sided weakness, PVD, HTN, HLD, COPD with prior tobacco abuse, dementia, Parkinsonian features, PUD, melanoma of the back, history of sinus tachycardia, and chronic abdominal pain who presented to [facility] on 12/1 with nausea/vomiting s/p choking on food. He was found to be septic 2/2 a likely aspiration PNA and develop sinus tachycardia with PACs on ECG and telemetry. Cardiology is consulted for ? Corey onset Afib.    Patient was previously admitted to [facility] in 2014 for TIA-like symptoms. He was consulted on by [facility] and felt to be ok for outpatient work up. Per Care Everywhere he did not have any outpatient cardiac evaluation. He has continually declining from a cognitive and functional standpoint. He presented to [facility] on 12/1 after apparently choking on some food and developing a fever. It is unclear when this choking event actually occurred. The patient does tell me he has been coughing more lately. Because of his cough and fever he was brought to the [facility].   Upon the patient's arrival to [facility] they were found to have troponin of 0.19, lactic acid of 2.4, WBC 17.8 blood culture x 2 were drawn. ECG showed sinus tachycardiac with PACs, 133 bpm, rare PVC, nonspecific st/t changes. CXR showed right hilar PNA and possible mild ileus. In the ED he was thought to be in Afib and he was started on heparin gtt along with Cardizem bolus. He was also started on vancomycin, Zosyn and nebs by IM. At the time of cardiology consultation the two ECG's were reviewed and show multiple P waves throughout indicating this is not Afib and in  fact sinus tachycardiac with PACs. On telemetry he remains in sinus tachycardia with heart rates running in the 130's to 160s.   Unfortunately, history is taken from prior notes and I have been unable to contact any family to discuss his condition.     Past Medical History  Diagnosis Date  . Chronic pain   . BPH (benign prostatic hyperplasia)   . Melanoma ([facility])   . Tobacco abuse   . Pneumonia February 2013  . 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 Anemia   . Depression       Most Recent Cardiac Studies: none   Surgical History:  Past Surgical History  Procedure Laterality Date  . Skin surgery      for melanoma     Home Meds: Prior to Admission medications   Medication Sig Start Date End Date Taking? Authorizing Provider  aspirin EC 81 MG tablet Take 81 mg by mouth daily.   Yes Historical Provider, [facility]  buPROPion (WELLBUTRIN SR) 150 MG 12 hr tablet Take 150 mg by mouth 2 (two) times daily.   Yes Historical Provider, [facility]  carbidopa-levodopa (SINEMET IR) 25-100 MG tablet Take 1 tablet by mouth 3 (three) times daily. 09/16/15  Yes Historical Provider, [facility]  Cyanocobalamin (RA VITAMIN B-12 TR) 1000 MCG TBCR Take 1,000 mcg by mouth daily.   Yes Historical Provider, [facility]  donepezil (ARICEPT) 5 MG tablet Take 5 mg by mouth at bedtime. 09/25/15  Yes Historical Provider, [facility]  gabapentin (NEURONTIN) 300 MG  capsule Take 300 mg by mouth 5 (five) times daily.   Yes Historical Provider, [facility]  hydrOXYzine (ATARAX/VISTARIL) 10 MG tablet Take 10 mg by mouth 3 (three) times daily as needed. For itching. 07/26/15  Yes Historical Provider, [facility]  ipratropium-albuterol (DUONEB) 0.5-2.5 (3) MG/3ML SOLN Take 3 mLs by nebulization every 6 (six) hours as needed. For asthma. 09/16/15  Yes Historical Provider, [facility]  mirtazapine (REMERON) 15 MG tablet Take 15 mg by mouth at bedtime. 09/27/15  Yes Historical Provider, [facility]  Tamsulosin HCl (FLOMAX) 0.4 MG CAPS Take 0.4 mg by mouth 2 (two) times daily.     Yes Historical Provider, [facility]  amoxicillin-clavulanate (AUGMENTIN) 875-125 MG tablet Take 1 tablet by mouth 2 (two) times daily. 10/15/15   Corey Costa, [facility]    Inpatient Medications:  . aspirin  300 mg Rectal Daily  . ipratropium-albuterol  3 mL Nebulization Q4H  . pantoprazole (PROTONIX) IV  40 mg Intravenous Q24H  . piperacillin-tazobactam (ZOSYN)  IV  3.375 g Intravenous Q8H  . sodium chloride  3 mL Intravenous Q12H  . [START ON 11/18/2015] Vancomycin  750 mg Intravenous Once  . [START ON 11/18/2015] Vancomycin  750 mg Intravenous Q18H   . dextrose 5 % and 0.45% NaCl 125 mL/hr at 11/17/15 1138  . heparin 800 Units/hr (11/17/15 1045)    Allergies:  Allergies  Allergen Reactions  . Morphine And Related Shortness Of Breath    Social History   Social History  . Marital Status: Married    Spouse Name: N/A  . Number of Children: N/A  . Years of Education: N/A   Occupational History  . Not on file.   Social History Main Topics  . Smoking status: Current Every Day Smoker -- 2.00 packs/day for 50 years  . Smokeless tobacco: Not on file  . Alcohol Use: Yes     Comment: occasional  . Drug Use: No  . Sexual Activity: Not on file   Other Topics Concern  . Not on file   Social History Narrative     Family History  Problem Relation Age of Onset  . Stroke Mother   . Heart disease Mother   . Heart disease Father   . Cancer Sister      Review of Systems: Review of Systems  Unable to perform ROS: dementia    Labs:  Recent Labs  11/17/15 0852  TROPONINI 0.19*   Lab Results  Component Value Date   WBC 17.8* 11/17/2015   HGB 14.3 11/17/2015   HCT 44.1 11/17/2015   MCV 102.7* 11/17/2015   PLT 466* 11/17/2015     Recent Labs Lab 11/17/15 0852  NA 133*  K 4.2  CL 95*  CO2 28  BUN 32*  CREATININE 1.04  CALCIUM 8.7*  PROT 7.1  BILITOT 0.7  ALKPHOS 135*  ALT 21  AST 34  GLUCOSE 111*   No results found for: CHOL, HDL, LDLCALC, TRIG No results found  for: DDIMER  Radiology/Studies:  Dg Abd Acute W/chest  11/17/2015  CLINICAL DATA:  Abdominal pain.  Foul-smelling urine. EXAM: DG ABDOMEN ACUTE W/ 1V CHEST COMPARISON:  Chest x-ray 10/14/2015 FINDINGS: The upright chest x-ray demonstrates a right hilar opacity. This was not present on the prior chest film and is on likely a mass. It is most likely a perihilar pneumonia. No pleural effusion. The cardiac silhouette, mediastinal and hilar contours are unremarkable. Prominent skin fold over the left chest but no definite pneumothorax. A left lower lobe calcified granuloma is  noted. Two views of the abdomen demonstrate moderate air scattered throughout the small bowel and colon which could suggest a mild diffuse ileus or gastroenteritis. No findings for small bowel obstruction or free air. The soft tissue shadows are maintained. Vascular calcifications are noted. The bony structures are intact. IMPRESSION: 1. Right hilar opacity, most likely pneumonia. Recommend post treatment follow-up chest x-ray in 3-4 weeks to make sure this resolves. 2. Possible mild ileus or gastroenteritis. No findings for obstruction or perforation. Electronically Signed   By: Marijo Sanes M.D.   On: 11/17/2015 09:34    EKG: sinus tachycardiac with PACs, 133 bpm, rare PVC, nonspecific st/t changes Telemetry: sinus tachycardia with runs of SVT, 140's to 160's bpm.   Weights: Filed Weights   11/17/15 0844  Weight: 123 lb (55.792 kg)     Physical Exam: Blood pressure 108/59, pulse 61, temperature 98.9 F (37.2 C), temperature source Oral, resp. rate 18, height 5\' 8"  (1.727 m), weight 123 lb (55.792 kg), SpO2 97 %. Body mass index is 18.71 kg/(m^2). General: Well developed, well nourished, in no acute distress. Head: Normocephalic, atraumatic, sclera non-icteric, no xanthomas, nares are without discharge.  Neck: Negative for carotid bruits. JVD not elevated. Lungs: Rhonchi right side. Breathing is unlabored. Heart:  Tachycardic, irregular, with S1 S2. No murmurs, rubs, or gallops appreciated. Abdomen: Soft, non-tender, non-distended with normoactive bowel sounds. No hepatomegaly. No rebound/guarding. No obvious abdominal masses. Msk:  Strength and tone appear normal for age. Extremities: No clubbing or cyanosis. No edema.  Distal pedal pulses are 2+ and equal bilaterally. Neuro: Alert and oriented X 3. No facial asymmetry. No focal deficit. Moves all extremities spontaneously. Psych:  Responds to questions appropriately with a normal affect.    Assessment and Plan:   1. Sinus tachycardia with frequent PACs: -No signs of Afib on ECG or telemetry at this time -Will discontinue heparin gtt -He is at high risk of cardiac arrhythmia given his underlying infection  -The above is being driven by his underlying sepsis/PNA, treat the underlying cause and this will improve -Can assist with rate control with beta blocker if BP allows, asymptomatic currently   2. Sepsis in the setting of possible aspiration PNA: -On vancomycin and Zosyn per IM -Nebs per IM -Treatment per IM  3. Elevated troponin: -Mildly elevated  -Likely supply demand ischemia in the setting of his sepsis, PNA, leukocytosis, and rate related  -Continue to trend troponin  -Consider echo when HR is improved  -Could pursue outpatient nuclear stress testing if indicated     Signed, Christell Faith, PA-C Pager: [phone] 11/17/2015, 3:00 PM

## 2015-11-17 NOTE — Progress Notes (Signed)
ANTICOAGULATION CONSULT NOTE - Initial Consult  Pharmacy Consult for Heparin Drip Indication: atrial fibrillation  Allergies  Allergen Reactions  . Morphine And Related Shortness Of Breath    Patient Measurements: Height: 5\' 8"  (172.7 cm) Weight: 123 lb (55.792 kg) IBW/kg (Calculated) : 68.4 Heparin Dosing Weight: 55.8 kg  Vital Signs: Temp: 97.8 F (36.6 C) (12/01 0844) Temp Source: Oral (12/01 0844) BP: 131/86 mmHg (12/01 1030) Pulse Rate: 75 (12/01 1030)  Labs:  Recent Labs  11/17/15 0852  HGB 14.3  HCT 44.1  PLT 466*  CREATININE 1.04  TROPONINI 0.19*    Estimated Creatinine Clearance: 48.4 mL/min (by C-G formula based on Cr of 1.04).   Medical History: Past Medical History  Diagnosis Date  . Chronic pain   . BPH (benign prostatic hyperplasia)   . Melanoma ([place])   . Tobacco abuse   . Pneumonia February 2013  . 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 Anemia   . Depression     Medications:  Scheduled:  . heparin  2,800 Units Intravenous Once   Infusions:  . diltiazem (CARDIZEM) infusion    . heparin    . piperacillin-tazobactam 3.375 g (11/17/15 1029)  . vancomycin      Assessment: 74 yo Male to be started on Heparin drip for Atrial Fibrillation. Patient on aspirin 81 mg at home.  HX stroke  APTT= pending INR = pending  Goal of Therapy:  Heparin level 0.3-0.7 units/ml Monitor platelets by anticoagulation protocol: Yes   Plan:  Give 2800 units bolus x 1  Start Heparin drip at 800 units/hr Will check Heparin Level in 8 hrs at 1900.  Chinita Greenland PharmD Clinical Pharmacist 11/17/2015 10:40 AM

## 2015-11-17 NOTE — H&P (Signed)
[place] [facility] [place] NAME: [facility]   MR#: [id number]  DATE OF BIRTH: May 23, 1941  DATE OF ADMISSION: 11/17/2015  PRIMARY CARE PHYSICIAN: Tama High III, MD   REQUESTING/REFERRING PHYSICIAN:   CHIEF COMPLAINT:  Chief Complaint  Patient presents with  . Dysuria  . Fever    HISTORY OF PRESENT ILLNESS: Corey [facility] is a 74 y.o. male with a known history of recent admission for pneumonia, history of stroke with right-sided weakness, COPD, Parkinson's dementia who presents to the hospital with nausea, vomiting, choking, as well as strangling on food. The patient's wife as patient is not able to provide any history due to dementia. Patient has been having problems with choking strangling on food regurgitating have having nausea and vomiting for the past week. On arrival to emergency room, he was noted to be in atrial fibrillation, RVR, rate of 150. His labs revealed hyponatremia, hypoalbuminemia, elevated troponin to 0.19. Elevated white blood cell count is 17.8 and elevated lactic acid level to 2.4. Radiologic studies revealed a right hilar opacity concerning for pneumonia and mild ileus. Patient was given bolus of Cardizem with no significant improvement of his A. fib, RVR, so he was started on Cardizem IV drip. Patient denies any pain. Hospitalist services were contacted for admission  PAST MEDICAL HISTORY:  Past Medical History  Diagnosis Date  . Chronic pain   . BPH (benign prostatic hyperplasia)   . Melanoma ([place])   . Tobacco abuse   . Pneumonia February 2013  . 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 Anemia   . Depression     PAST SURGICAL HISTORY:  Past Surgical History  Procedure Laterality Date  . Skin surgery      for melanoma    SOCIAL HISTORY:  Social History  Substance Use Topics  . Smoking status: Current Every Day  Smoker -- 2.00 packs/day for 50 years  . Smokeless tobacco: Not on file  . Alcohol Use: Yes     Comment: occasional    FAMILY HISTORY: No family history on file.  DRUG ALLERGIES:  Allergies  Allergen Reactions  . Morphine And Related Shortness Of Breath    Review of Systems  Unable to perform ROS: dementia    MEDICATIONS AT HOME:  Prior to Admission medications   Medication Sig Start Date End Date Taking? Authorizing Provider  aspirin EC 81 MG tablet Take 81 mg by mouth daily.   Yes Historical Provider, MD  buPROPion (WELLBUTRIN SR) 150 MG 12 hr tablet Take 150 mg by mouth 2 (two) times daily.   Yes Historical Provider, MD  carbidopa-levodopa (SINEMET IR) 25-100 MG tablet Take 1 tablet by mouth 3 (three) times daily. 09/16/15  Yes Historical Provider, MD  Cyanocobalamin (RA VITAMIN B-12 TR) 1000 MCG TBCR Take 1,000 mcg by mouth daily.   Yes Historical Provider, MD  donepezil (ARICEPT) 5 MG tablet Take 5 mg by mouth at bedtime. 09/25/15  Yes Historical Provider, MD  gabapentin (NEURONTIN) 300 MG capsule Take 300 mg by mouth 5 (five) times daily.   Yes Historical Provider, MD  hydrOXYzine (ATARAX/VISTARIL) 10 MG tablet Take 10 mg by mouth 3 (three) times daily as needed. For itching. 07/26/15  Yes Historical Provider, MD  ipratropium-albuterol (DUONEB) 0.5-2.5 (3) MG/3ML SOLN Take 3 mLs by nebulization every 6 (six) hours as needed. For asthma. 09/16/15  Yes Historical Provider, MD  mirtazapine (REMERON) 15 MG tablet  Take 15 mg by mouth at bedtime. 09/27/15  Yes Historical Provider, MD  Tamsulosin HCl (FLOMAX) 0.4 MG CAPS Take 0.4 mg by mouth 2 (two) times daily.    Yes Historical Provider, MD  amoxicillin-clavulanate (AUGMENTIN) 875-125 MG tablet Take 1 tablet by mouth 2 (two) times daily. 10/15/15   Bettey Costa, MD     PHYSICAL EXAMINATION:   VITAL SIGNS: Blood pressure 131/86, pulse 75,  temperature 97.8 F (36.6 C), temperature source Oral, resp. rate 14, height 5\' 8"  (1.727 m), weight 55.792 kg (123 lb), SpO2 95 %.  GENERAL: 74 y.o.-year-old patient lying in the bed with no acute distress.  EYES: Pupils equal, round, reactive to light and accommodation. No scleral icterus. Extraocular muscles intact.  HEENT: Head atraumatic, normocephalic. Oropharynx and nasopharynx clear.  NECK: Supple, no jugular venous distention. No thyroid enlargement, no tenderness.  LUNGS: Diminished breath sounds on the right, few crackles on the left, better air entrance on the left, no wheezing, few rales,rhonchi and crepitations noted bilaterally. No use of accessory muscles of respiration.  CARDIOVASCULAR: S1, S2 , tachycardic, distant. No murmurs, rubs, or gallops.  ABDOMEN: Soft, nontender, nondistended. Bowel sounds present. No organomegaly or mass.  EXTREMITIES: No pedal edema, cyanosis, or clubbing.  NEUROLOGIC: Cranial nerves II through XII are intact. Muscle strength 5/5 in all extremities. Sensation intact. Gait not checked.  PSYCHIATRIC: The patient is alert but not oriented.  SKIN: Crusted rash, lesions, ulcerations noted in bilateral toes, very painful to palpation. Peripheral pulses are 1+.   LABORATORY PANEL:   CBC  Last Labs      Recent Labs Lab 11/17/15 0852  WBC 17.8*  HGB 14.3  HCT 44.1  PLT 466*  MCV 102.7*  MCH 33.2  MCHC 32.4  RDW 18.5*  LYMPHSABS 0.8*  MONOABS 1.7*  EOSABS 0.0  BASOSABS 0.1     ------------------------------------------------------------------------------------------------------------------  Chemistries   Last Labs      Recent Labs Lab 11/17/15 0852  NA 133*  K 4.2  CL 95*  CO2 28  GLUCOSE 111*  BUN 32*  CREATININE 1.04  CALCIUM 8.7*  AST 34  ALT 21  ALKPHOS 135*  BILITOT 0.7      ------------------------------------------------------------------------------------------------------------------  Cardiac Enzymes  Last Labs      Recent Labs Lab 11/17/15 0852  TROPONINI 0.19*     ------------------------------------------------------------------------------------------------------------------  RADIOLOGY:  Imaging Results (Last 48 hours)    Dg Abd Acute W/chest  11/17/2015 CLINICAL DATA: Abdominal pain. Foul-smelling urine. EXAM: DG ABDOMEN ACUTE W/ 1V CHEST COMPARISON: Chest x-ray 10/14/2015 FINDINGS: The upright chest x-ray demonstrates a right hilar opacity. This was not present on the prior chest film and is on likely a mass. It is most likely a perihilar pneumonia. No pleural effusion. The cardiac silhouette, mediastinal and hilar contours are unremarkable. Prominent skin fold over the left chest but no definite pneumothorax. A left lower lobe calcified granuloma is noted. Two views of the abdomen demonstrate moderate air scattered throughout the small bowel and colon which could suggest a mild diffuse ileus or gastroenteritis. No findings for small bowel obstruction or free air. The soft tissue shadows are maintained. Vascular calcifications are noted. The bony structures are intact. IMPRESSION: 1. Right hilar opacity, most likely pneumonia. Recommend post treatment follow-up chest x-ray in 3-4 weeks to make sure this resolves. 2. Possible mild ileus or gastroenteritis. No findings for obstruction or perforation. Electronically Signed By: Marijo Sanes M.D. On: 11/17/2015 09:34     EKG: Orders placed or performed  during the hospital encounter of 11/17/15  . EKG 12-Lead  . EKG 12-Lead  . EKG 12-Lead  . EKG 12-Lead    IMPRESSION AND PLAN:  Active Problems:  Sepsis ([place])  Right lower lobe pneumonia  Atrial fibrillation with RVR ([facility])  Elevated troponin  Dysphagia  Nausea & vomiting  Ileus ([facility]) 1. Sepsis due to pneumonia,  admitted patient medical floor. Blood cultures are taken. In emergency room, initiate patient on broad-spectrum antibiotics due to recent hospitalization 2. Right-sided pneumonia, suspicious for aspiration, get speech therapist evaluation, keep patient nothing by mouth, continue patient on Zosyn for now, get sputum cultures if possible 3. A. fib, RVR, continue patient on Cardizem IV drip changed to oral Cardizem whenever able to take orally, continue IV heparin 4. Elevated troponin. Possibly related to tachycardia, get cardiologist involved for further recommendations. Continue heparin for now 5. Dysphagia. Speech therapist evaluation  6. Nausea and vomiting. Supportive therapy, IV fluids, nothing by mouth since it is concerning for ileus 7 Ileus, nothing by mouth, get KUB in the morning. IV fluids, get NG tube placed if recurrent nausea and vomiting , PPI IV    All the records are reviewed and case discussed with ED provider. Management plans discussed with the patient, family and they are in agreement.  CODE STATUS:    TOTAL CRITICAL CARETIME TAKING CARE OF THIS PATIENT: 65 minutes .    Theodoro Grist M.D on 11/17/2015 at 10:53 AM  Between 7am to 6pm - Pager - [phone] After 6pm go to [email or web address] - password [email or web address] [facility]  [facility]  Office [phone]  CC: Primary care physician; Adin Hector, MD

## 2015-11-17 NOTE — Progress Notes (Signed)
[facility] is alert and able to follow commands. HR in the 120's. Cardizem drip at 5mg /h. Heparin drip discontinued. Foley placed due to urinary retention. Bladder scan result of 369ml. 2l of o2 nasal cannula. BP stable

## 2015-11-17 NOTE — ED Provider Notes (Signed)
CSN: [id number]     Arrival date & time 11/17/15  [id number] History   First MD Initiated Contact with Patient 11/17/15 0830     Chief Complaint  Patient presents with  . Dysuria  . Fever     (Consider location/radiation/quality/duration/timing/severity/associated sxs/prior Treatment) The history is provided by the spouse. The history is limited by the condition of the patient.  Corey Hughes is a 74 y.o. male history of pneumonia, melanoma, previous stroke, Parkinson's here presenting with fever, vomiting. Patient has been vomiting and has poor appetite for the last 4 days. Fever 100.4 as per the wife yesterday. She offered him tylenol but he spit it up. He is weaker than usual and less responsive. He was admitted 2 months ago for sepsis of unknown origin.     Level V caveat- dementia    Past Medical History  Diagnosis Date  . Chronic pain   . BPH (benign prostatic hyperplasia)   . Melanoma ([place])   . Tobacco abuse   . Pneumonia February 2013  . 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 Anemia   . Depression    Past Surgical History  Procedure Laterality Date  . Skin surgery      for melanoma   No family history on file. Social History  Substance Use Topics  . Smoking status: Current Every Day Smoker -- 2.00 packs/day for 50 years  . Smokeless tobacco: None  . Alcohol Use: Yes     Comment: occasional    Review of Systems  Unable to perform ROS: Dementia  All other systems reviewed and are negative.     Allergies  Morphine and related  Home Medications   Prior to Admission medications   Medication Sig Start Date End Date Taking? Authorizing Provider  albuterol-ipratropium (COMBIVENT) 18-103 MCG/ACT inhaler Inhale 2 puffs into the lungs every 4 (four) hours as needed for wheezing or shortness of breath. 02/03/12 02/02/13  Rexene Alberts, MD  amoxicillin-clavulanate (AUGMENTIN) 875-125 MG tablet Take 1 tablet by mouth 2 (two) times daily.  10/15/15   Bettey Costa, MD  aspirin EC 81 MG tablet Take 81 mg by mouth daily.    Historical Provider, MD  buPROPion (WELLBUTRIN SR) 150 MG 12 hr tablet Take 150 mg by mouth 2 (two) times daily.    Historical Provider, MD  carbidopa-levodopa (SINEMET IR) 25-100 MG tablet Take 1 tablet by mouth 3 (three) times daily. 09/16/15   Historical Provider, MD  Cyanocobalamin (RA VITAMIN B-12 TR) 1000 MCG TBCR Take 1,000 mcg by mouth daily.    Historical Provider, MD  donepezil (ARICEPT) 5 MG tablet Take 5 mg by mouth at bedtime. 09/25/15   Historical Provider, MD  gabapentin (NEURONTIN) 300 MG capsule Take 300 mg by mouth 5 (five) times daily.    Historical Provider, MD  hydrOXYzine (ATARAX/VISTARIL) 10 MG tablet Take 10 mg by mouth 3 (three) times daily as needed. For itching. 07/26/15   Historical Provider, MD  ipratropium-albuterol (DUONEB) 0.5-2.5 (3) MG/3ML SOLN Take 3 mLs by nebulization every 6 (six) hours as needed. For asthma. 09/16/15   Historical Provider, MD  mirtazapine (REMERON) 15 MG tablet Take 15 mg by mouth at bedtime. 09/27/15   Historical Provider, MD  predniSONE (DELTASONE) 20 MG tablet Take 20 mg by mouth daily. 08/29/15   Historical Provider, MD  Tamsulosin HCl (FLOMAX) 0.4 MG CAPS Take 0.4 mg by mouth 2 (two) times daily.     Historical Provider,  MD  traMADol (ULTRAM) 50 MG tablet Take 50 mg by mouth 3 (three) times daily as needed. For pain. 09/25/15   Historical Provider, MD  triamcinolone cream (KENALOG) 0.1 % Apply 1 application topically 2 (two) times daily. 07/22/15   Historical Provider, MD  triamcinolone ointment (KENALOG) 0.1 % Apply 1 application topically 2 (two) times daily. Apply on the skin until rash clear. Avoid face, groin, axilla. 08/11/15   Historical Provider, MD   BP 152/103 mmHg  Pulse 143  Temp(Src) 97.8 F (36.6 C) (Oral)  Resp 27  Ht 5\' 8"  (1.727 m)  Wt 123 lb (55.792 kg)  BMI 18.71 kg/m2  SpO2 89% Physical Exam  Constitutional:  Tired, confused   HENT:   Head: Normocephalic.  MM dry   Eyes: Conjunctivae are normal. Pupils are equal, round, and reactive to light.  Neck: Normal range of motion. Neck supple.  No meningeal signs   Cardiovascular: Normal rate, regular rhythm and normal heart sounds.   Pulmonary/Chest: Effort normal.  Crackles bilateral bases   Abdominal: Soft. Bowel sounds are normal. He exhibits no distension. There is no tenderness. There is no rebound.  Musculoskeletal: Normal range of motion. He exhibits no edema or tenderness.  Neurological:  Confused, moving all extremities   Skin: Skin is warm and dry.  Psychiatric:  Unable   Nursing note and vitals reviewed.   ED Course  Procedures (including critical care time)  CRITICAL CARE Performed by: Darl Householder, Carisma Troupe   Total critical care time: 30 minutes  Critical care time was exclusive of separately billable procedures and treating other patients.  Critical care was necessary to treat or prevent imminent or life-threatening deterioration.  Critical care was time spent personally by me on the following activities: development of treatment plan with patient and/or surrogate as well as nursing, discussions with consultants, evaluation of patient's response to treatment, examination of patient, obtaining history from patient or surrogate, ordering and performing treatments and interventions, ordering and review of laboratory studies, ordering and review of radiographic studies, pulse oximetry and re-evaluation of patient's condition.   Labs Review Labs Reviewed  CBC WITH DIFFERENTIAL/PLATELET - Abnormal; Notable for the following:    WBC 17.8 (*)    RBC 4.30 (*)    MCV 102.7 (*)    RDW 18.5 (*)    Platelets 466 (*)    Neutro Abs 15.1 (*)    Lymphs Abs 0.8 (*)    Monocytes Absolute 1.7 (*)    All other components within normal limits  COMPREHENSIVE METABOLIC PANEL - Abnormal; Notable for the following:    Sodium 133 (*)    Chloride 95 (*)    Glucose, Bld 111 (*)     BUN 32 (*)    Calcium 8.7 (*)    Albumin 2.4 (*)    Alkaline Phosphatase 135 (*)    All other components within normal limits  LACTIC ACID, PLASMA - Abnormal; Notable for the following:    Lactic Acid, Venous 2.4 (*)    All other components within normal limits  URINALYSIS COMPLETEWITH MICROSCOPIC ([facility] ONLY) - Abnormal; Notable for the following:    Color, Urine YELLOW (*)    APPearance CLEAR (*)    Hgb urine dipstick 1+ (*)    Protein, ur 100 (*)    Squamous Epithelial / LPF 0-5 (*)    All other components within normal limits  TROPONIN I - Abnormal; Notable for the following:    Troponin I 0.19 (*)    All  other components within normal limits  BLOOD GAS, VENOUS - Abnormal; Notable for the following:    Bicarbonate 31.1 (*)    Acid-Base Excess 5.3 (*)    All other components within normal limits  CULTURE, BLOOD (ROUTINE X 2)  CULTURE, BLOOD (ROUTINE X 2)  URINE CULTURE    Imaging Review Dg Abd Acute W/chest  11/17/2015  CLINICAL DATA:  Abdominal pain.  Foul-smelling urine. EXAM: DG ABDOMEN ACUTE W/ 1V CHEST COMPARISON:  Chest x-ray 10/14/2015 FINDINGS: The upright chest x-ray demonstrates a right hilar opacity. This was not present on the prior chest film and is on likely a mass. It is most likely a perihilar pneumonia. No pleural effusion. The cardiac silhouette, mediastinal and hilar contours are unremarkable. Prominent skin fold over the left chest but no definite pneumothorax. A left lower lobe calcified granuloma is noted. Two views of the abdomen demonstrate moderate air scattered throughout the small bowel and colon which could suggest a mild diffuse ileus or gastroenteritis. No findings for small bowel obstruction or free air. The soft tissue shadows are maintained. Vascular calcifications are noted. The bony structures are intact. IMPRESSION: 1. Right hilar opacity, most likely pneumonia. Recommend post treatment follow-up chest x-ray in 3-4 weeks to make sure this  resolves. 2. Possible mild ileus or gastroenteritis. No findings for obstruction or perforation. Electronically Signed   By: Marijo Sanes M.D.   On: 11/17/2015 09:34   I have personally reviewed and evaluated these images and lab results as part of my medical decision-making.   EKG Interpretation None       ED ECG REPORT I, Shawne Bulow, the attending physician, personally viewed and interpreted this ECG.   Date: 11/17/2015  EKG Time: 8:38  Rate: 133  Rhythm: atrial fibrillation, rate 133  Axis: normal  Intervals:none  ST&T Change: nonspecific   MDM   Final diagnoses:  None   Corey Hughes is a 74 y.o. male here with AMS, fever, vomiting. Consider sepsis from pneumonia vs UTI vs bacteremia. Will get labs, cultures. Will likely admit.   10:04 AM WBC 17. Lactate 2.4. CXR showed pneumonia. Patient's UA nl. Also has new onset rapid afib rate 150s. Also hypoxic. Has elevated trop as well but no chest pain and no obvious STEMI. Given vanc/zosyn. Started on heparin drip. Given 2 L NS (30 cc/kg bolus) but still tachy. Tried cardizem bolus but HR still 130-150s. Started on cardizem drip. Will admit to ICU.     Wandra Arthurs, MD 11/17/15 1026

## 2015-11-17 NOTE — Progress Notes (Signed)
ANTICOAGULATION CONSULT NOTE - Initial Consult  Pharmacy Consult for Heparin Drip Indication: atrial fibrillation  Allergies  Allergen Reactions  . Morphine And Related Shortness Of Breath    Patient Measurements: Height: 5\' 8"  (172.7 cm) Weight: 123 lb (55.792 kg) IBW/kg (Calculated) : 68.4 Heparin Dosing Weight: 55.8 kg  Vital Signs: Temp: 98.7 F (37.1 C) (12/01 2000) Temp Source: Oral (12/01 2000) BP: 121/67 mmHg (12/01 2000) Pulse Rate: 95 (12/01 2000)  Labs:  Recent Labs  11/17/15 0852 11/17/15 1304 11/17/15 1558  HGB 14.3  --   --   HCT 44.1  --   --   PLT 466*  --   --   APTT  --  44*  --   LABPROT  --  15.4*  --   INR  --  1.20  --   CREATININE 1.04  --   --   TROPONINI 0.19*  --  0.04*    Estimated Creatinine Clearance: 48.4 mL/min (by C-G formula based on Cr of 1.04).   Medical History: Past Medical History  Diagnosis Date  . Chronic pain   . BPH (benign prostatic hyperplasia)   . Melanoma ([place])   . Tobacco abuse   . Pneumonia February 2013  . 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 Anemia   . Depression     Medications:  Scheduled:  . aspirin  300 mg Rectal Daily  . diltiazem (CARDIZEM) infusion  5-15 mg/hr Intravenous Once  . diltiazem  30 mg Oral 4 times per day  . ipratropium-albuterol  3 mL Nebulization Q4H  . pantoprazole (PROTONIX) IV  40 mg Intravenous Q24H  . piperacillin-tazobactam (ZOSYN)  IV  3.375 g Intravenous Q8H  . sodium chloride  3 mL Intravenous Q12H  . [START ON 11/18/2015] Vancomycin  750 mg Intravenous Once  . [START ON 11/18/2015] Vancomycin  750 mg Intravenous Q18H   Infusions:  . dextrose 5 % and 0.45% NaCl 125 mL/hr at 11/17/15 2030    Assessment: 74 yo Male to be started on Heparin drip for Atrial Fibrillation. Patient on aspirin 81 mg at home.  HX stroke  APTT= pending INR = pending  Goal of Therapy:  Heparin level 0.3-0.7 units/ml Monitor platelets by anticoagulation  protocol: Yes   Plan:  Give 2800 units bolus x 1  Start Heparin drip at 800 units/hr Will check Heparin Level in 8 hrs at 1900.  12/01 :  Heparin gtt d/c'd by PA. Will d/c consult.   Evalyne Cortopassi D Clinical Pharmacist 11/17/2015 8:55 PM

## 2015-11-17 NOTE — ED Notes (Signed)
Critical lab troponin 0.19 Dr Darl Householder notified

## 2015-11-17 NOTE — ED Notes (Signed)
Critical Lab called Lactic Acid 2.4

## 2015-11-18 ENCOUNTER — Inpatient Hospital Stay: Payer: Medicare Other

## 2015-11-18 DIAGNOSIS — A419 Sepsis, unspecified organism: Principal | ICD-10-CM

## 2015-11-18 DIAGNOSIS — J9601 Acute respiratory failure with hypoxia: Secondary | ICD-10-CM

## 2015-11-18 DIAGNOSIS — Z87891 Personal history of nicotine dependence: Secondary | ICD-10-CM

## 2015-11-18 DIAGNOSIS — G20 Parkinson's disease: Secondary | ICD-10-CM

## 2015-11-18 DIAGNOSIS — G8929 Other chronic pain: Secondary | ICD-10-CM

## 2015-11-18 DIAGNOSIS — D72819 Decreased white blood cell count, unspecified: Secondary | ICD-10-CM

## 2015-11-18 DIAGNOSIS — J189 Pneumonia, unspecified organism: Secondary | ICD-10-CM

## 2015-11-18 DIAGNOSIS — E46 Unspecified protein-calorie malnutrition: Secondary | ICD-10-CM

## 2015-11-18 DIAGNOSIS — I4891 Unspecified atrial fibrillation: Secondary | ICD-10-CM

## 2015-11-18 DIAGNOSIS — Z515 Encounter for palliative care: Secondary | ICD-10-CM

## 2015-11-18 DIAGNOSIS — R1310 Dysphagia, unspecified: Secondary | ICD-10-CM

## 2015-11-18 DIAGNOSIS — Z85828 Personal history of other malignant neoplasm of skin: Secondary | ICD-10-CM

## 2015-11-18 DIAGNOSIS — I248 Other forms of acute ischemic heart disease: Secondary | ICD-10-CM

## 2015-11-18 DIAGNOSIS — Z8673 Personal history of transient ischemic attack (TIA), and cerebral infarction without residual deficits: Secondary | ICD-10-CM

## 2015-11-18 DIAGNOSIS — N400 Enlarged prostate without lower urinary tract symptoms: Secondary | ICD-10-CM

## 2015-11-18 DIAGNOSIS — R000 Tachycardia, unspecified: Secondary | ICD-10-CM

## 2015-11-18 DIAGNOSIS — G9341 Metabolic encephalopathy: Secondary | ICD-10-CM

## 2015-11-18 DIAGNOSIS — D473 Essential (hemorrhagic) thrombocythemia: Secondary | ICD-10-CM

## 2015-11-18 DIAGNOSIS — Z66 Do not resuscitate: Secondary | ICD-10-CM

## 2015-11-18 DIAGNOSIS — F329 Major depressive disorder, single episode, unspecified: Secondary | ICD-10-CM

## 2015-11-18 DIAGNOSIS — D649 Anemia, unspecified: Secondary | ICD-10-CM

## 2015-11-18 DIAGNOSIS — F0280 Dementia in other diseases classified elsewhere without behavioral disturbance: Secondary | ICD-10-CM

## 2015-11-18 LAB — CBC
HCT: 33.9 % — ABNORMAL LOW (ref 40.0–52.0)
Hemoglobin: 11 g/dL — ABNORMAL LOW (ref 13.0–18.0)
MCH: 33.1 pg (ref 26.0–34.0)
MCHC: 32.4 g/dL (ref 32.0–36.0)
MCV: 102.1 fL — ABNORMAL HIGH (ref 80.0–100.0)
PLATELETS: 399 10*3/uL (ref 150–440)
RBC: 3.32 MIL/uL — AB (ref 4.40–5.90)
RDW: 18.2 % — AB (ref 11.5–14.5)
WBC: 16 10*3/uL — AB (ref 3.8–10.6)

## 2015-11-18 LAB — BASIC METABOLIC PANEL
Anion gap: 7 (ref 5–15)
BUN: 26 mg/dL — ABNORMAL HIGH (ref 6–20)
CALCIUM: 7.5 mg/dL — AB (ref 8.9–10.3)
CO2: 24 mmol/L (ref 22–32)
CREATININE: 0.95 mg/dL (ref 0.61–1.24)
Chloride: 100 mmol/L — ABNORMAL LOW (ref 101–111)
GFR calc non Af Amer: >60 mL/min (ref >60)
Glucose, Bld: 145 mg/dL — ABNORMAL HIGH (ref 65–99)
Potassium: 3.3 mmol/L — ABNORMAL LOW (ref 3.5–5.1)
SODIUM: 131 mmol/L — AB (ref 135–145)

## 2015-11-18 LAB — TROPONIN I: TROPONIN I: 0.05 ng/mL — AB (ref <0.031)

## 2015-11-18 MED ORDER — IPRATROPIUM-ALBUTEROL 0.5-2.5 (3) MG/3ML IN SOLN
3.0000 mL | Freq: Three times a day (TID) | RESPIRATORY_TRACT | Status: DC
  Administered 2015-11-18 – 2015-11-23 (×15): 3 mL via RESPIRATORY_TRACT
  Filled 2015-11-18 (×15): qty 3

## 2015-11-18 MED ORDER — ENOXAPARIN SODIUM 40 MG/0.4ML ~~LOC~~ SOLN
40.0000 mg | SUBCUTANEOUS | Status: DC
  Administered 2015-11-18 – 2015-11-22 (×5): 40 mg via SUBCUTANEOUS
  Filled 2015-11-18 (×5): qty 0.4

## 2015-11-18 MED ORDER — CARBIDOPA-LEVODOPA 25-100 MG PO TABS
1.0000 | ORAL_TABLET | Freq: Three times a day (TID) | ORAL | Status: DC
  Administered 2015-11-18 – 2015-11-23 (×15): 1 via ORAL
  Filled 2015-11-18 (×15): qty 1

## 2015-11-18 NOTE — Progress Notes (Signed)
Initial Nutrition Assessment  DOCUMENTATION CODES:   Non-severe (moderate) malnutrition in context of chronic illness  INTERVENTION:   Meals and Snacks: Cater to patient preferences Medical Food Supplement Therapy: recommend addition of Might[employer]akes TID, Magic Cup at [employer] and dinner  NUTRITION DIAGNOSIS:   Malnutrition related to chronic illness as evidenced by percent weight loss, mild depletion of muscle mass, mild depletion of body fat.  GOAL:   Patient will meet greater than or equal to 90% of their needs   MONITOR:    (Energy Intake, Anthropometrics, Digestive System, Electrolyte/Renal Profile)  REASON FOR ASSESSMENT:   Malnutrition Screening Tool    ASSESSMENT:    Pt admitted with right-sided weakness, COPD, Parkinson's dementia with N/V, choking as well as strangling on food. Pt alert on visit today, when answering questions pt only says "yes"  Past Medical History  Diagnosis Date  . Chronic pain   . BPH (benign prostatic hyperplasia)   . Melanoma ([place])   . Tobacco abuse   . Pneumonia February 2013  . 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 Anemia   . Depression     Diet Order:  DIET - DYS 1 Room service appropriate?: Yes with Assist; Fluid consistency:: Nectar Thick; SLP following  Energy Intake: pt reports he is hungry, ready to eat; awaiting meal tray as diet just advanced   Food and Nutrition Related History: unable to assess  Electrolyte and Renal Profile:  Recent Labs Lab 11/17/15 0852 11/18/15 0655  BUN 32* 26*  CREATININE 1.04 0.95  NA 133* 131*  K 4.2 3.3*   Glucose Profile: No results for input(s): GLUCAP in the last 72 hours. Meds: D5-1/2 NS at 125 ml/hr  Nutrition Focused Physical Exam: Nutrition-Focused physical exam completed. Findings are mild fat depletion, mild muscle depletion, and no edema. Unable to visualize legs during assessment  Height:   Ht Readings from Last 1 Encounters:  11/17/15 5'  8" (1.727 m)    Weight: pt unable to verbalize if he has lost weight recently, just stares at writer when question asked; per wt encounters, pt 25.5% wt loss  Wt Readings from Last 1 Encounters:  11/17/15 123 lb (55.792 kg)   I Wt Readings from Last 10 Encounters:  11/17/15 123 lb (55.792 kg)  10/14/15 165 lb (74.844 kg)  10/15/15 165 lb (74.844 kg)  02/02/12 137 lb 9.6 oz (62.415 kg)    BMI:  Body mass index is 18.71 kg/(m^2).  Estimated Nutritional Needs:   Kcal:  1453-1719 kcals (BEE 1102, 1.2 AF, 1.1-1.3 IF)   Protein:  64-75 g (1.1-1.3 g/kg)   Fluid:  1450-1740 mL (25-30 ml/kg)   HIGH Care Level  Kerman Passey MS, RD, LDN [phone] Pager

## 2015-11-18 NOTE — Plan of Care (Signed)
Problem: SLP Dysphagia Goals Goal: Misc Dysphagia Goal Pt will safely tolerate po diet of least restrictive consistency w/ no overt s/s of aspiration noted by Staff/pt/family x3 sessions.    

## 2015-11-18 NOTE — Evaluation (Signed)
Clinical/Bedside Swallow Evaluation Patient Details  Name: Corey Hughes MRN: [id number] Date of Birth: 22-Jul-1941  Today's Date: 11/18/2015 Time: SLP Start Time (ACUTE ONLY): [id number] SLP Stop Time (ACUTE ONLY): 1025 SLP Time Calculation (min) (ACUTE ONLY): 60 min  Past Medical History:  Past Medical History  Diagnosis Date  . Chronic pain   . BPH (benign prostatic hyperplasia)   . Melanoma ([place])   . Tobacco abuse   . Pneumonia February 2013  . 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 Anemia   . Depression    Past Surgical History:  Past Surgical History  Procedure Laterality Date  . Skin surgery      for melanoma   HPI:  Pt is a 74 y.o. male history of chronic pain, old stroke, depresssion, melanoma, pneumonia, melanoma, Parkinson's Dis. w/ Dementia presenting with fever, nausea and vomiting. Patient has been vomiting and has poor appetite for the last 4 days. Fever 100.4 as per the wife yesterday. She offered him tylenol but he spit it up. He is weaker than usual and less responsive. He was admitted 2 months ago for sepsis of unknown origin. Concern for possible ileus. Pt responds to basic questions only; less verbal output.    Assessment / Plan / Recommendation Clinical Impression  Pt appeared to present w/ oropharyngeal phase dysphagia w/ reduced bolus control/awareness during the oral phase and w/ suspected delayed pharyngeal swallow w/ thin liquids resulting in overt s/s of aspiration (immediate coughing). Pt appeared to adequate and safely tolerate trials of Nectar consistency liquids consuming ~3 ozs w/ 3 ozs of puree w/ no overt s/s of aspiration and no decline in respiratory status during/post intake. Pt required feeding assistance sec. to Cognitive status. Pt appears to present w/ increased risk for aspiration; rec. a Dys. 1 w/ Nectar liquids diet w/ aspiration precautions; meds in puree; feeding assistance at all meals.     Aspiration Risk   Moderate aspiration risk    Diet Recommendation  Dysphagia leve 1 w/ Nectar liquids; aspiration precautions; feeding assistance at meals   Medication Administration: Crushed with puree    Other  Recommendations Recommended Consults:  (Dietician) Oral Care Recommendations: Oral care BID;Staff/trained caregiver to provide oral care Other Recommendations: Order thickener from pharmacy;Prohibited food (jello, ice cream, thin soups);Remove water pitcher   Follow up Recommendations  Skilled Nursing facility (TBD)    Frequency and Duration min 3x week  1 week       Prognosis Prognosis for Safe Diet Advancement: Fair Barriers to Reach Goals: Cognitive deficits      Swallow Study   General Date of Onset: 11/17/15 HPI: Pt is a 74 y.o. male history of chronic pain, old stroke, depresssion, melanoma, pneumonia, melanoma, Parkinson's Dis. w/ Dementia presenting with fever, nausea and vomiting. Patient has been vomiting and has poor appetite for the last 4 days. Fever 100.4 as per the wife yesterday. She offered him tylenol but he spit it up. He is weaker than usual and less responsive. He was admitted 2 months ago for sepsis of unknown origin. Concern for possible ileus. Pt responds to basic questions only; less verbal output.  Type of Study: Bedside Swallow Evaluation Previous Swallow Assessment: none Diet Prior to this Study: Regular;Thin liquids (unkown; "regular food" per pt report) Temperature Spikes Noted: Yes (at admission; 16.0 wbc) Respiratory Status: Nasal cannula (4 liters) History of Recent Intubation: No Behavior/Cognition: Cooperative;Pleasant mood;Confused;Distractible;Requires cueing (awake) Oral Cavity Assessment: Dry Oral Care Completed  by SLP: Yes Oral Cavity - Dentition: Edentulous Self-Feeding Abilities: Needs assist;Needs set up;Total assist Patient Positioning: Upright in bed Baseline Vocal Quality: Low vocal intensity Volitional Cough: Cognitively unable to  elicit Volitional Swallow: Unable to elicit    Oral/Motor/Sensory Function Overall Oral Motor/Sensory Function: Within functional limits (grossly w/ inconsistent follow through by pt)   Ice Chips Ice chips: Within functional limits Presentation: Spoon (fed; 5 trials)   Thin Liquid Thin Liquid: Impaired Presentation: Self Fed;Cup (assisted; x1 trial) Oral Phase Impairments: Poor awareness of bolus (bringing cup to mouth ) Oral Phase Functional Implications:  (reduced bolus control) Pharyngeal  Phase Impairments: Cough - Immediate;Suspected delayed Swallow    Nectar Thick Nectar Thick Liquid: Within functional limits Presentation: Cup;Self Fed;Straw (assisted; ~3 ozs)   Honey Thick Honey Thick Liquid: Not tested   Puree Puree: Within functional limits Presentation: Spoon (fed; 3 ozs)   Solid Solid: Not tested      Corey Kenner, MS, CCC-SLP  Corey Hughes 11/18/2015,11:44 AM

## 2015-11-18 NOTE — Care Management Note (Signed)
Case Management Note  Patient Details  Name: CALIN ROMINGER MRN: [id number] Date of Birth: [birth year]  Subjective/Objective:    TC received from Jana Half, RN with [facility] [phone]). Patient is active with [facility] for SN, HHA, SLP, SW            Action/Plan:   Expected Discharge Date:                  Expected Discharge Plan:     In-House Referral:     Discharge planning Services     Post Acute Care Choice:    Choice offered to:     DME Arranged:    DME Agency:     HH Arranged:    [place] Agency:     Status of Service:     Medicare Important Message Given:    Date Medicare IM Given:    Medicare IM give by:    Date Additional Medicare IM Given:    Additional Medicare Important Message give by:     If discussed at [place] of Stay Meetings, dates discussed:    Additional Comments:  Jolly Mango, RN 11/18/2015, 12:05 PM

## 2015-11-18 NOTE — Progress Notes (Signed)
Patient ID: Corey Hughes, male   DOB: [birth year], 74 y.o.   MRN: [id number] [facility] PROGRESS NOTE  PCP: Adin Hector, MD  HPI/Subjective: Patient answers some yes or no questions. Does not complain of much. Admitted with fever and found to have pneumonia.   Objective: Filed Vitals:   11/18/15 1500 11/18/15 1600  BP: 112/84 123/74  Pulse: 88 96  Temp:  99.1 F (37.3 C)  Resp: 26 25    Intake/Output Summary (Last 24 hours) at 11/18/15 1640 Last data filed at 11/18/15 1400  Gross per 24 hour  Intake 3240.08 ml  Output    425 ml  Net 2815.08 ml   Filed Weights   11/17/15 0844  Weight: 55.792 kg (123 lb)    ROS: Review of Systems  Constitutional: Positive for fever. Negative for chills.  Eyes: Negative for blurred vision.  Respiratory: Positive for cough and shortness of breath.   Cardiovascular: Negative for chest pain.  Gastrointestinal: Negative for nausea, vomiting, abdominal pain, diarrhea and constipation.  Genitourinary: Negative for dysuria.  Musculoskeletal: Negative for joint pain.  Neurological: Negative for dizziness and headaches.   Exam: Physical Exam  HENT:  Nose: No mucosal edema.  Mouth/Throat: No oropharyngeal exudate or posterior oropharyngeal edema.  Eyes: Conjunctivae and lids are normal. Pupils are equal, round, and reactive to light.  Did not look to the left for me.  Neck: No JVD present. Carotid bruit is not present. No edema present. No thyroid mass and no thyromegaly present.  Cardiovascular: S1 normal and S2 normal.  Exam reveals no gallop.   Murmur heard.  Systolic murmur is present with a grade of 2/6  Pulses:      Dorsalis pedis pulses are 2+ on the right side, and 2+ on the left side.  Respiratory: No respiratory distress. He has no wheezes. He has rhonchi in the right lower field and the left lower field. He has no rales.  GI: Soft. Bowel sounds are normal. There is no tenderness.  Musculoskeletal:   Right ankle: He exhibits swelling.       Left ankle: He exhibits swelling.  Lymphadenopathy:    He has no cervical adenopathy.  Neurological: He is alert.  Patient did not want to look to the left. He is able to lift up both arms on his own. He tries to lift up his legs up off the bed but was not able to do it too well.  Skin: Skin is warm. Nails show no clubbing.  Bilateral feet and some reddish blackish discoloration on the toes bilaterally  Psychiatric: His affect is blunt. His speech is delayed.    Data Reviewed: Basic Metabolic Panel:  Recent Labs Lab 11/17/15 0852 11/18/15 0655  NA 133* 131*  K 4.2 3.3*  CL 95* 100*  CO2 28 24  GLUCOSE 111* 145*  BUN 32* 26*  CREATININE 1.04 0.95  CALCIUM 8.7* 7.5*   Liver Function Tests:  Recent Labs Lab 11/17/15 0852  AST 34  ALT 21  ALKPHOS 135*  BILITOT 0.7  PROT 7.1  ALBUMIN 2.4*   CBC:  Recent Labs Lab 11/17/15 0852 11/18/15 0655  WBC 17.8* 16.0*  NEUTROABS 15.1*  --   HGB 14.3 11.0*  HCT 44.1 33.9*  MCV 102.7* 102.1*  PLT 466* 399   Cardiac Enzymes:  Recent Labs Lab 11/17/15 0852 11/17/15 1558 11/17/15 2147 11/18/15 0559  TROPONINI 0.19* 0.04* 0.03 0.05*     Recent Results (from the past 240  hour(s))  Urine culture     Status: None (Preliminary result)   Collection Time: 11/17/15  8:37 AM  Result Value Ref Range Status   Specimen Description URINE, RANDOM  Final   Special Requests NONE  Final   Culture NO GROWTH 1 DAY  Final   Report Status PENDING  Incomplete  MRSA PCR Screening     Status: None   Collection Time: 11/17/15  1:05 PM  Result Value Ref Range Status   MRSA by PCR NEGATIVE NEGATIVE Final    Comment:        The GeneXpert MRSA Assay (FDA approved for NASAL specimens only), is one component of a comprehensive MRSA colonization surveillance program. It is not intended to diagnose MRSA infection nor to guide or monitor treatment for MRSA infections.      Studies: Abd 1 View  (kub)  11/18/2015  CLINICAL DATA:  Ileus. EXAM: ABDOMEN - 1 VIEW COMPARISON:  11/17/2015. FINDINGS: Soft tissue structures are unremarkable. Interim partial resolution of air-filled small bowel. Colonic gas pattern is stable. No free air. Degenerative changes lumbar spine. Pelvic calcifications consistent phleboliths. Atherosclerotic vascular calcifications. IMPRESSION: 1. Interim partial resolution of air-filled small bowel. Colonic gas pattern is normal. No free air. Findings consistent with improving adynamic ileus. 2. Aortoiliac atherosclerotic vascular disease . Electronically Signed   By: Marcello Moores  Register   On: 11/18/2015 07:15   Dg Abd Acute W/chest  11/17/2015  CLINICAL DATA:  Abdominal pain.  Foul-smelling urine. EXAM: DG ABDOMEN ACUTE W/ 1V CHEST COMPARISON:  Chest x-ray 10/14/2015 FINDINGS: The upright chest x-ray demonstrates a right hilar opacity. This was not present on the prior chest film and is on likely a mass. It is most likely a perihilar pneumonia. No pleural effusion. The cardiac silhouette, mediastinal and hilar contours are unremarkable. Prominent skin fold over the left chest but no definite pneumothorax. A left lower lobe calcified granuloma is noted. Two views of the abdomen demonstrate moderate air scattered throughout the small bowel and colon which could suggest a mild diffuse ileus or gastroenteritis. No findings for small bowel obstruction or free air. The soft tissue shadows are maintained. Vascular calcifications are noted. The bony structures are intact. IMPRESSION: 1. Right hilar opacity, most likely pneumonia. Recommend post treatment follow-up chest x-ray in 3-4 weeks to make sure this resolves. 2. Possible mild ileus or gastroenteritis. No findings for obstruction or perforation. Electronically Signed   By: Marijo Sanes M.D.   On: 11/17/2015 09:34    Scheduled Meds: . antiseptic oral rinse  7 mL Mouth Rinse BID  . aspirin  300 mg Rectal Daily  . diltiazem  30 mg Oral  4 times per day  . enoxaparin (LOVENOX) injection  40 mg Subcutaneous Q24H  . ipratropium-albuterol  3 mL Nebulization TID  . pantoprazole (PROTONIX) IV  40 mg Intravenous Q24H  . piperacillin-tazobactam (ZOSYN)  IV  3.375 g Intravenous Q8H  . sodium chloride  3 mL Intravenous Q12H  . Vancomycin  750 mg Intravenous Q18H   Continuous Infusions: . dextrose 5 % and 0.45% NaCl 125 mL/hr at 11/18/15 1201  . diltiazem (CARDIZEM) infusion Stopped (11/18/15 1328)    Assessment/Plan:  1. Clinical sepsis. Likely aspiration pneumonia in the right lung. Patient is empirically on Zosyn and vancomycin. Likely can stop vancomycin tomorrow if cultures remain negative. 2. Atrial fibrillation with rapid ventricular response- patient was titrated off Cardizem drip and put on oral Cardizem. 3. Dysphagia- patient was put on dysphagia diet by speech therapy.  Overall prognosis is poor and palliative care consultation ordered. 4. Elevated troponin demand ischemia from sepsis and atrial fibrillation with rapid ventricular rate. 5. Parkinson's with dementia- restart Sinemet. Appreciate palliative care consultation 6. Acute encephalopathy- could be decline in patient's status with the Parkinson's and dementia and being critically ill. Hold off on CAT scan at this point and watch clinically. Patient had a recent MRI of the brain. 7. Acute respiratory failure with hypoxia- from pneumonia. Continue oxygen supplementation  Code Status:     Code Status Orders        Start     Ordered   11/18/15 1452  Do not attempt resuscitation (DNR)   Continuous    Question Answer Comment  In the event of cardiac or respiratory ARREST Do not call a "code blue"   In the event of cardiac or respiratory ARREST Do not perform Intubation, CPR, defibrillation or ACLS   In the event of cardiac or respiratory ARREST Use medication by any route, position, wound care, and other measures to relive pain and suffering. May use oxygen,  suction and manual treatment of airway obstruction as needed for comfort.   Comments DNR and DNI and NO FEEDING TUBES      11/18/15 1451     Family Communication: Wife and daughter at the bedside. Palliative care made a DO NOT RESUSCITATE Disposition Plan: Potentially home with hospice  Consultants:  Palliative care team  Antibiotics:  Vancomycin  Zosyn  Time spent: 30 minutes  Loletha Grayer  [facility]

## 2015-11-18 NOTE — Progress Notes (Signed)
Paged and spoke to cardiologist on call regarding cardizem drip being discontinued and PO cardizem ordered per Dr. Fletcher Anon.  Patient has a pending speech evaluation for aspiration precautions and is NPO at this time.  Per Dr. Aundra Dubin, leave patient on cardizem drip through the night until speech evaluation is performed in the morning.  Will continue to monitor.

## 2015-11-18 NOTE — Progress Notes (Signed)
   SUBJECTIVE: : No acute events. Still NPO. Getting swallowing evaluation today.     Filed Vitals:   11/18/15 0500 11/18/15 0600 11/18/15 0700 11/18/15 0740  BP: 107/52 109/54 105/59   Pulse: 94 70 87   Temp:    98.7 F (37.1 C)  TempSrc:    Oral  Resp: 18 29 23    Height:      Weight:      SpO2: 96% 95% 97%     Intake/Output Summary (Last 24 hours) at 11/18/15 1003 Last data filed at 11/18/15 0700  Gross per 24 hour  Intake   2801 ml  Output    225 ml  Net   2576 ml    LABS: Basic Metabolic Panel:  Recent Labs  11/17/15 0852 11/18/15 0655  NA 133* 131*  K 4.2 3.3*  CL 95* 100*  CO2 28 24  GLUCOSE 111* 145*  BUN 32* 26*  CREATININE 1.04 0.95  CALCIUM 8.7* 7.5*   Liver Function Tests:  Recent Labs  11/17/15 0852  AST 34  ALT 21  ALKPHOS 135*  BILITOT 0.7  PROT 7.1  ALBUMIN 2.4*   No results for input(s): LIPASE, AMYLASE in the last 72 hours. CBC:  Recent Labs  11/17/15 0852 11/18/15 0655  WBC 17.8* 16.0*  NEUTROABS 15.1*  --   HGB 14.3 11.0*  HCT 44.1 33.9*  MCV 102.7* 102.1*  PLT 466* 399   Cardiac Enzymes:  Recent Labs  11/17/15 1558 11/17/15 2147 11/18/15 0559  TROPONINI 0.04* 0.03 0.05*   BNP: Invalid input(s): POCBNP D-Dimer: No results for input(s): DDIMER in the last 72 hours. Hemoglobin A1C: No results for input(s): HGBA1C in the last 72 hours. Fasting Lipid Panel: No results for input(s): CHOL, HDL, LDLCALC, TRIG, CHOLHDL, LDLDIRECT in the last 72 hours. Thyroid Function Tests: No results for input(s): TSH, T4TOTAL, T3FREE, THYROIDAB in the last 72 hours.  Invalid input(s): FREET3 Anemia Panel: No results for input(s): VITAMINB12, FOLATE, FERRITIN, TIBC, IRON, RETICCTPCT in the last 72 hours.   PHYSICAL EXAM General: Well developed, well nourished, in no acute distress HEENT:  Normocephalic and atramatic Neck:  No JVD.  Lungs: Clear bilaterally to auscultation and percussion. Heart: HRRR . Normal S1 and S2  without gallops or murmurs.  Abdomen: Bowel sounds are positive, abdomen soft and non-tender  Msk:  Back normal, normal gait. Normal strength and tone for age. Extremities: No clubbing, cyanosis or edema.   Neuro: Alert and oriented X 3. Psych:  Good affect, responds appropriately  TELEMETRY: Reviewed telemetry pt in multifocal atrial tachycardia.   ASSESSMENT AND PLAN:   1.   multifocal atrial tachycardia likely due to underlying lung disease. No signs of Afib on ECG or telemetry . No need for Heparin .  - Continue Cardizem drip until he is able to take PO.   2. Sepsis in the setting of possible aspiration PNA: -On vancomycin and Zosyn per IM -Nebs per IM -Treatment per IM  3. Elevated troponin: -Mildly elevated  -Likely supply demand ischemia in the setting of his sepsis, PNA, leukocytosis, and rate related    Kathlyn Sacramento, MD, [facility] 11/18/2015 10:03 AM

## 2015-11-18 NOTE — Progress Notes (Addendum)
Patient took cardizem P.O. Crushed in applesauce. Cardizem gtt decreased to 5 mg. Will monitor heart rate/rhythm.

## 2015-11-18 NOTE — Clinical Documentation Improvement (Signed)
Internal Medicine   POSSIBLE CONDITIONS?        Respiratory Failure  Document Acuity - Acute, Chronic, Acute on Chronic   Respiratory Failure  Other  Clinically Undetermined  Document any associated diagnoses/conditions. Please update your documentation within the medical record to reflect your response to this query. Thank you.  Supporting Information: (as per notes)SOB, Hypoxic,Crackles  Oxygen - 4L/m via n/c  SPO2 11-17-15= 85  Please exercise your independent, professional judgment when responding. A specific answer is not anticipated or expected.  Thank You, Alessandra Grout, RN, BSN, CCDS,Clinical Documentation Specialist:  [phone]  [phone]=[facility] [place]- Health Information Management

## 2015-11-18 NOTE — Progress Notes (Signed)
ANTIBIOTIC CONSULT NOTE - Follow up  Pharmacy Consult for Vancomycin/Zosyn Indication: Sepsis/HCAP  Allergies  Allergen Reactions  . Morphine And Related Shortness Of Breath   Patient Measurements: Height: 5\' 8"  (172.7 cm) Weight: 123 lb (55.792 kg) IBW/kg (Calculated) : 68.4  Vital Signs: Temp: 98.7 F (37.1 C) (12/02 0740) Temp Source: Oral (12/02 0740) BP: 112/61 mmHg (12/02 1100) Pulse Rate: 86 (12/02 1100) Intake/Output from previous day: 12/01 0701 - 12/02 0700 In: 2801 [I.V.:2551; IV Piggyback:250] Out: 225 [Urine:225] Intake/Output from this shift: Total I/O In: 590 [I.V.:540; IV Piggyback:50] Out: 200 [Urine:200]  Recent Labs  11/17/15 0852 11/18/15 0655  WBC 17.8* 16.0*  HGB 14.3 11.0*  PLT 466* 399  CREATININE 1.04 0.Corey   Estimated Creatinine Clearance: 53 mL/min (by C-G formula based on Cr of 0.Corey).   Microbiology: Recent Results (from the past 720 hour(s))  Urine culture     Status: None (Preliminary result)   Collection Time: 11/17/15  8:37 AM  Result Value Ref Range Status   Specimen Description URINE, RANDOM  Final   Special Requests NONE  Final   Culture NO GROWTH 1 DAY  Final   Report Status PENDING  Incomplete  MRSA PCR Screening     Status: None   Collection Time: 11/17/15  1:05 PM  Result Value Ref Range Status   MRSA by PCR NEGATIVE NEGATIVE Final    Comment:        The GeneXpert MRSA Assay (FDA approved for NASAL specimens only), is one component of a comprehensive MRSA colonization surveillance program. It is not intended to diagnose MRSA infection nor to guide or monitor treatment for MRSA infections.     Medications:  Scheduled:  . antiseptic oral rinse  7 mL Mouth Rinse BID  . aspirin  300 mg Rectal Daily  . diltiazem  30 mg Oral 4 times per day  . enoxaparin (LOVENOX) injection  40 mg Subcutaneous Q24H  . ipratropium-albuterol  3 mL Nebulization TID  . pantoprazole (PROTONIX) IV  40 mg Intravenous Q24H  .  piperacillin-tazobactam (ZOSYN)  IV  3.375 g Intravenous Q8H  . sodium chloride  3 mL Intravenous Q12H  . Vancomycin  750 mg Intravenous Q18H   Anti-infectives    Start     Dose/Rate Route Frequency Ordered Stop   11/18/15 2000  vancomycin (VANCOCIN) IVPB 750 mg/150 ml premix     750 mg 150 mL/hr over 60 Minutes Intravenous Every 18 hours 11/17/15 1157     11/18/15 0100  vancomycin (VANCOCIN) IVPB 750 mg/150 ml premix     750 mg 150 mL/hr over 60 Minutes Intravenous  Once 11/17/15 1157 11/18/15 0300   11/17/15 1800  piperacillin-tazobactam (ZOSYN) IVPB 3.375 g     3.375 g 12.5 mL/hr over 240 Minutes Intravenous Every 8 hours 11/17/15 1157     11/17/15 1030  piperacillin-tazobactam (ZOSYN) IVPB 3.375 g     3.375 g 100 mL/hr over 30 Minutes Intravenous  Once 11/17/15 1026 11/17/15 1059   11/17/15 1030  vancomycin (VANCOCIN) IVPB 1000 mg/200 mL premix     1,000 mg 200 mL/hr over 60 Minutes Intravenous  Once 11/17/15 1026 11/17/15 1150     Assessment: 74 yo Hughes with PMH of Parkinsons, PNA, and COPD.  Patient with hospitalization 2 months ago for Sepsis. Pharmacy consulted for dosing and monitoring of Vancomycin and Zosyn for PNA/Sepsis.  Ke 0.045  T1/2 15.4  Vd 39  Patient was started on Vancomycin 750mg  q18 hours.   CrCl:  53   Scr: 0.Corey (down from 1.04)   Goal of Therapy:  Vancomycin trough level 15-20 mcg/ml  Plan:  Will continue Zosyn 3.375 EI q8 hours and vancomycin 750mg  every 18 hours.  Trough scheduled prior to 5th dose on 12/4 @ 0730. Pharmacy will continue to monitor labs and renal function and make adjustments as needed.  Nancy Fetter, PharmD Pharmacy Resident 11/18/2015 12:19 PM

## 2015-11-18 NOTE — Progress Notes (Signed)
Palliative Care Update   I visited pt and met with pt's daughter and wife today.  Wife chooses DNR status for pt at this time.  She says she is HCPOA and that pt has a Living Will and that she will bring in a copy of these forms next time she comes back here.  Daughter wanted to ask Leon (pt's son) but wife is the decision maker and at the end of the meeting, it was felt to be OK to go ahead and place a DNR order in the chart, given that pts wife is honoring what she feels her husband would want.    Family does NOT want pt to return to any kind of a nursing facility setting when he leaves here.  They were not happy with that experience.    Family would like to have Hospice in the Home (their home is in [place]) IF he is going to live for several months (best medical estimate).    And they would like Hospice Home IF he does not seem to be recovering from this illness episode.  They would want Hospice Home in [place].    The plan is to see how he does over the weekend and then revisit the options again at that time.   Pt would NOT want a Feeding Tube either.      Fer[place] , MD Palliative Care  Full consult note to follow.  

## 2015-11-18 NOTE — Progress Notes (Signed)
O2 sat dropped to 84-85% on 3 Liter nasal cannula and Oxygen increased to 4 liters. Cardizem off at this time. Will continue to monitor.

## 2015-11-19 ENCOUNTER — Encounter: Payer: Self-pay | Admitting: Cardiovascular Disease

## 2015-11-19 DIAGNOSIS — R7989 Other specified abnormal findings of blood chemistry: Secondary | ICD-10-CM

## 2015-11-19 DIAGNOSIS — E440 Moderate protein-calorie malnutrition: Secondary | ICD-10-CM | POA: Insufficient documentation

## 2015-11-19 DIAGNOSIS — I471 Supraventricular tachycardia: Secondary | ICD-10-CM

## 2015-11-19 HISTORY — DX: Supraventricular tachycardia: I47.1

## 2015-11-19 LAB — BASIC METABOLIC PANEL
ANION GAP: 7 (ref 5–15)
BUN: 22 mg/dL — ABNORMAL HIGH (ref 6–20)
CALCIUM: 7.5 mg/dL — AB (ref 8.9–10.3)
CO2: 23 mmol/L (ref 22–32)
Chloride: 101 mmol/L (ref 101–111)
Creatinine, Ser: 1.01 mg/dL (ref 0.61–1.24)
GFR calc Af Amer: >60 mL/min (ref >60)
GLUCOSE: 118 mg/dL — AB (ref 65–99)
POTASSIUM: 3.1 mmol/L — AB (ref 3.5–5.1)
SODIUM: 131 mmol/L — AB (ref 135–145)

## 2015-11-19 LAB — CBC
HCT: 32.5 % — ABNORMAL LOW (ref 40.0–52.0)
Hemoglobin: 10.8 g/dL — ABNORMAL LOW (ref 13.0–18.0)
MCH: 33.7 pg (ref 26.0–34.0)
MCHC: 33.2 g/dL (ref 32.0–36.0)
MCV: 101.4 fL — ABNORMAL HIGH (ref 80.0–100.0)
PLATELETS: 421 10*3/uL (ref 150–440)
RBC: 3.21 MIL/uL — AB (ref 4.40–5.90)
RDW: 17.8 % — AB (ref 11.5–14.5)
WBC: 18.4 10*3/uL — AB (ref 3.8–10.6)

## 2015-11-19 LAB — URINE CULTURE

## 2015-11-19 MED ORDER — POTASSIUM CHLORIDE 20 MEQ PO PACK
40.0000 meq | PACK | Freq: Once | ORAL | Status: AC
  Administered 2015-11-19: 40 meq via ORAL
  Filled 2015-11-19: qty 2

## 2015-11-19 MED ORDER — METOPROLOL TARTRATE 25 MG PO TABS
25.0000 mg | ORAL_TABLET | Freq: Two times a day (BID) | ORAL | Status: DC
  Administered 2015-11-19 – 2015-11-23 (×9): 25 mg via ORAL
  Filled 2015-11-19 (×9): qty 1

## 2015-11-19 MED ORDER — ASPIRIN 325 MG PO TABS
325.0000 mg | ORAL_TABLET | Freq: Every day | ORAL | Status: DC
  Administered 2015-11-19 – 2015-11-23 (×5): 325 mg via ORAL
  Filled 2015-11-19 (×5): qty 1

## 2015-11-19 MED ORDER — DILTIAZEM HCL 25 MG/5ML IV SOLN: 5.0000 mg | Freq: Four times a day (QID) | INTRAVENOUS | Status: DC | PRN

## 2015-11-19 NOTE — Progress Notes (Signed)
Patient transferred from ICU. Vitals are stable, temperature is slightly elevated at 99.2. No complaints of pain. Bed alarm activated and patient resting comfortably in bed. Running sinus arrhythmia on tele.

## 2015-11-19 NOTE — Progress Notes (Signed)
Patient ID: Corey Hughes, male   DOB: [birth year], 74 y.o.   MRN: [id number] [facility] Physicians PROGRESS NOTE  PCP: Tama High III, MD  HPI/Subjective: Multiple family members at this bedside unable to provide much history denies any significant complaints  Heart rate has improved off Cardizem drip currently  Objective: Filed Vitals:   11/19/15 1100 11/19/15 1146  BP: 138/73 138/73  Pulse: 106   Temp:    Resp: 26     Intake/Output Summary (Last 24 hours) at 11/19/15 1441 Last data filed at 11/19/15 1100  Gross per 24 hour  Intake   2988 ml  Output    975 ml  Net   2013 ml   Filed Weights   11/17/15 0844  Weight: 55.792 kg (123 lb)    ROS: Review of Systems  Constitutional: Positive for fever. Negative for chills.  Eyes: Negative for blurred vision.  Respiratory: Positive for cough and shortness of breath.   Cardiovascular: Negative for chest pain.  Gastrointestinal: Negative for nausea, vomiting, abdominal pain, diarrhea and constipation.  Genitourinary: Negative for dysuria.  Musculoskeletal: Negative for joint pain.  Neurological: Negative for dizziness and headaches.   Exam: Physical Exam  HENT:  Nose: No mucosal edema.  Mouth/Throat: No oropharyngeal exudate or posterior oropharyngeal edema.  Eyes: Conjunctivae and lids are normal. Pupils are equal, round, and reactive to light.  Did not look to the left for me.  Neck: No JVD present. Carotid bruit is not present. No edema present. No thyroid mass and no thyromegaly present.  Cardiovascular: S1 normal and S2 normal.  Exam reveals no gallop.   Murmur heard.  Systolic murmur is present with a grade of 2/6  Pulses:      Dorsalis pedis pulses are 2+ on the right side, and 2+ on the left side.  Respiratory: No respiratory distress. He has no wheezes. He has rhonchi in the right lower field and the left lower field. He has no rales.  GI: Soft. Bowel sounds are normal. There is no tenderness.   Musculoskeletal:       Right ankle: He exhibits swelling.       Left ankle: He exhibits swelling.  Lymphadenopathy:    He has no cervical adenopathy.  Neurological: He is alert.  Patient did not want to look to the left. He is able to lift up both arms on his own. He tries to lift up his legs up off the bed but was not able to do it too well.  Skin: Skin is warm. Nails show no clubbing.  Bilateral feet and some reddish blackish discoloration on the toes bilaterally  Psychiatric: His affect is blunt. His speech is delayed.    Data Reviewed: Basic Metabolic Panel:  Recent Labs Lab 11/17/15 0852 11/18/15 0655 11/19/15 0448  NA 133* 131* 131*  K 4.2 3.3* 3.1*  CL 95* 100* 101  CO2 28 24 23   GLUCOSE 111* 145* 118*  BUN 32* 26* 22*  CREATININE 1.04 0.95 1.01  CALCIUM 8.7* 7.5* 7.5*   Liver Function Tests:  Recent Labs Lab 11/17/15 0852  AST 34  ALT 21  ALKPHOS 135*  BILITOT 0.7  PROT 7.1  ALBUMIN 2.4*   CBC:  Recent Labs Lab 11/17/15 0852 11/18/15 0655 11/19/15 0448  WBC 17.8* 16.0* 18.4*  NEUTROABS 15.1*  --   --   HGB 14.3 11.0* 10.8*  HCT 44.1 33.9* 32.5*  MCV 102.7* 102.1* 101.4*  PLT 466* 399 421   Cardiac  Enzymes:  Recent Labs Lab 11/17/15 0852 11/17/15 1558 11/17/15 2147 11/18/15 0559  TROPONINI 0.19* 0.04* 0.03 0.05*     Recent Results (from the past 240 hour(s))  Blood culture (routine x 2)     Status: None (Preliminary result)   Collection Time: 11/17/15  8:37 AM  Result Value Ref Range Status   Specimen Description BLOOD  Final   Special Requests   Final    BOTTLES DRAWN AEROBIC AND ANAEROBIC  LEFT HAND AEROBIC 4 ML ANAEROBIC 4 ML   Culture NO GROWTH 2 DAYS  Final   Report Status PENDING  Incomplete  Urine culture     Status: None   Collection Time: 11/17/15  8:37 AM  Result Value Ref Range Status   Specimen Description URINE, RANDOM  Final   Special Requests NONE  Final   Culture INSIGNIFICANT GROWTH  Final   Report Status  11/19/2015 FINAL  Final  Blood culture (routine x 2)     Status: None (Preliminary result)   Collection Time: 11/17/15  8:49 AM  Result Value Ref Range Status   Specimen Description BLOOD  Final   Special Requests   Final    BOTTLES DRAWN AEROBIC AND ANAEROBIC  LEFT AC AEROBIC 4ML ANAEROBIC 4 ML   Culture NO GROWTH 2 DAYS  Final   Report Status PENDING  Incomplete  MRSA PCR Screening     Status: None   Collection Time: 11/17/15  1:05 PM  Result Value Ref Range Status   MRSA by PCR NEGATIVE NEGATIVE Final    Comment:        The GeneXpert MRSA Assay (FDA approved for NASAL specimens only), is one component of a comprehensive MRSA colonization surveillance program. It is not intended to diagnose MRSA infection nor to guide or monitor treatment for MRSA infections.      Studies: Abd 1 View (kub)  11/18/2015  CLINICAL DATA:  Ileus. EXAM: ABDOMEN - 1 VIEW COMPARISON:  11/17/2015. FINDINGS: Soft tissue structures are unremarkable. Interim partial resolution of air-filled small bowel. Colonic gas pattern is stable. No free air. Degenerative changes lumbar spine. Pelvic calcifications consistent phleboliths. Atherosclerotic vascular calcifications. IMPRESSION: 1. Interim partial resolution of air-filled small bowel. Colonic gas pattern is normal. No free air. Findings consistent with improving adynamic ileus. 2. Aortoiliac atherosclerotic vascular disease . Electronically Signed   By: Marcello Moores  Register   On: 11/18/2015 07:15    Scheduled Meds: . antiseptic oral rinse  7 mL Mouth Rinse BID  . aspirin  325 mg Oral Daily  . carbidopa-levodopa  1 tablet Oral TID  . diltiazem  30 mg Oral 4 times per day  . enoxaparin (LOVENOX) injection  40 mg Subcutaneous Q24H  . ipratropium-albuterol  3 mL Nebulization TID  . metoprolol tartrate  25 mg Oral BID  . pantoprazole (PROTONIX) IV  40 mg Intravenous Q24H  . piperacillin-tazobactam (ZOSYN)  IV  3.375 g Intravenous Q8H  . sodium chloride  3 mL  Intravenous Q12H  . Vancomycin  750 mg Intravenous Q18H   Continuous Infusions: . dextrose 5 % and 0.45% NaCl 125 mL/hr at 11/19/15 1100  . diltiazem (CARDIZEM) infusion Stopped (11/18/15 1328)    Assessment/Plan:  1. Clinical sepsis. Due to aspiration pneumonia in the right lung. WBC still elevated we will obtain a CT of the chest tomorrow continue current antibiotics 2. Atrial fibrillation with rapid ventricular response-continue oral Cardizem heart rate is stable 3. Dysphagia- patient was put on dysphagia diet by speech therapy. Overall  prognosis is poor and palliative care consultation appreciated 4. Elevated troponin demand ischemia from sepsis and atrial fibrillation with rapid ventricular rate. 5. Parkinson's with dementia-  Sinemet. Appreciate palliative care consultation 6. Acute encephalopathy- likely related to aspiration pneumonia 7. Acute respiratory failure with hypoxia- from pneumonia. Continue oxygen supplementation  Code Status:     Code Status Orders        Start     Ordered   11/18/15 1452  Do not attempt resuscitation (DNR)   Continuous    Question Answer Comment  In the event of cardiac or respiratory ARREST Do not call a "code blue"   In the event of cardiac or respiratory ARREST Do not perform Intubation, CPR, defibrillation or ACLS   In the event of cardiac or respiratory ARREST Use medication by any route, position, wound care, and other measures to relive pain and suffering. May use oxygen, suction and manual treatment of airway obstruction as needed for comfort.   Comments DNR and DNI and NO FEEDING TUBES      11/18/15 1451     Family Communication: Wife and daughter at the bedside. Palliative care made a DO NOT RESUSCITATE Disposition Plan: Potentially home with hospice  Consultants:  Palliative care team  Antibiotics:  Vancomycin  Zosyn  Time spent: 30 minutes  [place], [place] [facility]

## 2015-11-19 NOTE — Progress Notes (Signed)
Speech Language Pathology Dysphagia Treatment Patient Details Name: Corey Hughes MRN: [id number] DOB: [birth year] Today's Date: 11/19/2015 Time: 0825-0901 SLP Time Calculation (min) (ACUTE ONLY): 36 min  Assessment / Plan / Recommendation Clinical Impression   pt presents with a moderate to severe oral pharyngeal dysphagia characterized by watery eyes with trials of ndds2. Pt with anterior spillage noted with puree trials. Pt with no overt ssx aspiration with puree with nectar trials however was noted to have prolonged mastication and slowed a-[ transit time with all po intake. Pr is recommended to remain on a puree with nectar thick liquid diet at this time. Pt required total assist for meals and was not able to verbalize wants and needs at this time, however was able to answer yes and no questions.     Diet Recommendation    NDDS1 with nectar thick liquids.   SLP Plan   Continue with current poc  Pertinent Vitals/Pain No pain reported   Swallowing Goals     General Behavior/Cognition: Alert;Cooperative Patient Positioning: Upright in bed Oral care provided: N/A HPI: Pt is a 74 y.o. male history of chronic pain, old stroke, depresssion, melanoma, pneumonia, melanoma, Parkinson's Dis. w/ Dementia presenting with fever, nausea and vomiting. Patient has been vomiting and has poor appetite for the last 4 days. Fever 100.4 as per the wife yesterday. She offered him tylenol but he spit it up. He is weaker than usual and less responsive. He was admitted 2 months ago for sepsis of unknown origin. Concern for possible ileus. Pt responds to basic questions only; less verbal output.   Oral Cavity - Oral Hygiene     Dysphagia Treatment Treatment Methods: Skilled observation;Upgraded PO texture trial;Patient/caregiver education Patient observed directly with PO's: Yes Type of PO's observed: Dysphagia 1 (puree);Dysphagia 2 (chopped);Nectar-thick liquids Feeding: Total assist Liquids provided  via: Teaspoon;Cup Oral Phase Signs & Symptoms: Anterior loss/spillage;Prolonged mastication Pharyngeal Phase Signs & Symptoms: Suspected delayed swallow initiation;Multiple swallows;Watery eyes Type of cueing: Tactile Amount of cueing: Moderate   GO     [place] Sauber 11/19/2015, 10:02 AM

## 2015-11-19 NOTE — Progress Notes (Signed)
ANTIBIOTIC CONSULT NOTE - Follow up  Pharmacy Consult for Vancomycin/Zosyn Indication: Sepsis/HCAP  Allergies  Allergen Reactions  . Morphine And Related Shortness Of Breath   Patient Measurements: Height: 5\' 8"  (172.7 cm) Weight: 123 lb (55.792 kg) IBW/kg (Calculated) : 68.4  Vital Signs: Temp: 99.9 F (37.7 C) (12/03 0200) Temp Source: Oral (12/03 0200) BP: 138/73 mmHg (12/03 1146) Pulse Rate: 106 (12/03 1100) Intake/Output from previous day: 12/02 0701 - 12/03 0700 In: 3293.1 [P.O.:60; I.V.:2933.1; IV Piggyback:300] Out: 1175 [Urine:1175] Intake/Output from this shift: Total I/O In: 675 [I.V.:625; IV Piggyback:50] Out: -   Recent Labs  11/17/15 0852 11/18/15 0655 11/19/15 0448  WBC 17.8* 16.0* 18.4*  HGB 14.3 11.0* 10.8*  PLT 466* 399 421  CREATININE 1.04 0.95 1.01   Estimated Creatinine Clearance: 49.9 mL/min (by C-G formula based on Cr of 1.01).   Microbiology: Recent Results (from the past 720 hour(s))  Blood culture (routine x 2)     Status: None (Preliminary result)   Collection Time: 11/17/15  8:37 AM  Result Value Ref Range Status   Specimen Description BLOOD  Final   Special Requests   Final    BOTTLES DRAWN AEROBIC AND ANAEROBIC  LEFT HAND AEROBIC 4 ML ANAEROBIC 4 ML   Culture NO GROWTH 2 DAYS  Final   Report Status PENDING  Incomplete  Urine culture     Status: None   Collection Time: 11/17/15  8:37 AM  Result Value Ref Range Status   Specimen Description URINE, RANDOM  Final   Special Requests NONE  Final   Culture INSIGNIFICANT GROWTH  Final   Report Status 11/19/2015 FINAL  Final  Blood culture (routine x 2)     Status: None (Preliminary result)   Collection Time: 11/17/15  8:49 AM  Result Value Ref Range Status   Specimen Description BLOOD  Final   Special Requests   Final    BOTTLES DRAWN AEROBIC AND ANAEROBIC  LEFT AC AEROBIC 4ML ANAEROBIC 4 ML   Culture NO GROWTH 2 DAYS  Final   Report Status PENDING  Incomplete  MRSA PCR Screening      Status: None   Collection Time: 11/17/15  1:05 PM  Result Value Ref Range Status   MRSA by PCR NEGATIVE NEGATIVE Final    Comment:        The GeneXpert MRSA Assay (FDA approved for NASAL specimens only), is one component of a comprehensive MRSA colonization surveillance program. It is not intended to diagnose MRSA infection nor to guide or monitor treatment for MRSA infections.     Medications:  Scheduled:  . antiseptic oral rinse  7 mL Mouth Rinse BID  . aspirin  325 mg Oral Daily  . carbidopa-levodopa  1 tablet Oral TID  . diltiazem  30 mg Oral 4 times per day  . enoxaparin (LOVENOX) injection  40 mg Subcutaneous Q24H  . ipratropium-albuterol  3 mL Nebulization TID  . metoprolol tartrate  25 mg Oral BID  . pantoprazole (PROTONIX) IV  40 mg Intravenous Q24H  . piperacillin-tazobactam (ZOSYN)  IV  3.375 g Intravenous Q8H  . sodium chloride  3 mL Intravenous Q12H  . Vancomycin  750 mg Intravenous Q18H   Anti-infectives    Start     Dose/Rate Route Frequency Ordered Stop   11/18/15 2000  vancomycin (VANCOCIN) IVPB 750 mg/150 ml premix     750 mg 150 mL/hr over 60 Minutes Intravenous Every 18 hours 11/17/15 1157     11/18/15 0100  vancomycin (VANCOCIN) IVPB 750 mg/150 ml premix     750 mg 150 mL/hr over 60 Minutes Intravenous  Once 11/17/15 1157 11/18/15 0300   11/17/15 1800  piperacillin-tazobactam (ZOSYN) IVPB 3.375 g     3.375 g 12.5 mL/hr over 240 Minutes Intravenous Every 8 hours 11/17/15 1157     11/17/15 1030  piperacillin-tazobactam (ZOSYN) IVPB 3.375 g     3.375 g 100 mL/hr over 30 Minutes Intravenous  Once 11/17/15 1026 11/17/15 1059   11/17/15 1030  vancomycin (VANCOCIN) IVPB 1000 mg/200 mL premix     1,000 mg 200 mL/hr over 60 Minutes Intravenous  Once 11/17/15 1026 11/17/15 1150     Assessment: 74 yo Male with PMH of Parkinsons, PNA, and COPD.  Patient with hospitalization 2 months ago for Sepsis. Pharmacy consulted for dosing and monitoring of  Vancomycin and Zosyn for PNA/Sepsis.  Goal of Therapy:  Vancomycin trough level 15-20 mcg/ml  Plan:  Will continue Zosyn 3.375 EI q8 hours and vancomycin 750mg  every 18 hours.  Trough scheduled prior to 5th dose on 12/4 @ 0730. Pharmacy will continue to monitor labs and renal function and make adjustments as needed.  Ulice Dash, PharmD   11/19/2015 12:45 PM

## 2015-11-19 NOTE — Progress Notes (Signed)
Patient: Corey Hughes / Admit Date: 11/17/2015 / Date of Encounter: 11/19/2015, 10:54 AM   Subjective: No events overnight. Family in room.   Review of Systems: Review of Systems  Unable to perform ROS: acuity of condition    Objective: Telemetry: MAT, 1-teens Physical Exam: Blood pressure 121/79, pulse 110, temperature 99.9 F (37.7 C), temperature source Oral, resp. rate 23, height 5\' 8"  (1.727 m), weight 123 lb (55.792 kg), SpO2 90 %. Body mass index is 18.71 kg/(m^2). General: Well developed, well nourished, in no acute distress. Head: Normocephalic, atraumatic, sclera non-icteric, no xanthomas, nares are without discharge. Neck: Negative for carotid bruits. JVP not elevated. Lungs: Decreased breath sounds bilaterally. Breathing is unlabored. Heart: Tachycardic, irregular S1 S2 without murmurs, rubs, or gallops.  Abdomen: Soft, non-tender, non-distended with normoactive bowel sounds. No rebound/guarding. Extremities: No clubbing or cyanosis. No edema. Distal pedal pulses are 2+ and equal bilaterally. Neuro: Alert and oriented X 3. Moves all extremities spontaneously. Psych:  Responds to questions appropriately with a normal affect.   Intake/Output Summary (Last 24 hours) at 11/19/15 1054 Last data filed at 11/19/15 0600  Gross per 24 hour  Intake 2838.08 ml  Output   1175 ml  Net 1663.08 ml    Inpatient Medications:  . antiseptic oral rinse  7 mL Mouth Rinse BID  . aspirin  325 mg Oral Daily  . carbidopa-levodopa  1 tablet Oral TID  . diltiazem  30 mg Oral 4 times per day  . enoxaparin (LOVENOX) injection  40 mg Subcutaneous Q24H  . ipratropium-albuterol  3 mL Nebulization TID  . metoprolol tartrate  25 mg Oral BID  . pantoprazole (PROTONIX) IV  40 mg Intravenous Q24H  . piperacillin-tazobactam (ZOSYN)  IV  3.375 g Intravenous Q8H  . sodium chloride  3 mL Intravenous Q12H  . Vancomycin  750 mg Intravenous Q18H   Infusions:  . dextrose 5 % and 0.45% NaCl 125  mL/hr at 11/19/15 0600  . diltiazem (CARDIZEM) infusion Stopped (11/18/15 1328)    Labs:  Recent Labs  11/18/15 0655 11/19/15 0448  NA 131* 131*  K 3.3* 3.1*  CL 100* 101  CO2 24 23  GLUCOSE 145* 118*  BUN 26* 22*  CREATININE 0.95 1.01  CALCIUM 7.5* 7.5*    Recent Labs  11/17/15 0852  AST 34  ALT 21  ALKPHOS 135*  BILITOT 0.7  PROT 7.1  ALBUMIN 2.4*    Recent Labs  11/17/15 0852 11/18/15 0655 11/19/15 0448  WBC 17.8* 16.0* 18.4*  NEUTROABS 15.1*  --   --   HGB 14.3 11.0* 10.8*  HCT 44.1 33.9* 32.5*  MCV 102.7* 102.1* 101.4*  PLT 466* 399 421    Recent Labs  11/17/15 0852 11/17/15 1558 11/17/15 2147 11/18/15 0559  TROPONINI 0.19* 0.04* 0.03 0.05*   Invalid input(s): POCBNP No results for input(s): HGBA1C in the last 72 hours.   Weights: Filed Weights   11/17/15 0844  Weight: 123 lb (55.792 kg)     Radiology/Studies:  Abd 1 View (kub)  11/18/2015  CLINICAL DATA:  Ileus. EXAM: ABDOMEN - 1 VIEW COMPARISON:  11/17/2015. FINDINGS: Soft tissue structures are unremarkable. Interim partial resolution of air-filled small bowel. Colonic gas pattern is stable. No free air. Degenerative changes lumbar spine. Pelvic calcifications consistent phleboliths. Atherosclerotic vascular calcifications. IMPRESSION: 1. Interim partial resolution of air-filled small bowel. Colonic gas pattern is normal. No free air. Findings consistent with improving adynamic ileus. 2. Aortoiliac atherosclerotic vascular disease . Electronically Signed  By: [place]   On: 11/18/2015 07:15   Dg Abd Acute W/chest  11/17/2015  CLINICAL DATA:  Abdominal pain.  Foul-smelling urine. EXAM: DG ABDOMEN ACUTE W/ 1V CHEST COMPARISON:  Chest x-ray 10/14/2015 FINDINGS: The upright chest x-ray demonstrates a right hilar opacity. This was not present on the prior chest film and is on likely a mass. It is most likely a perihilar pneumonia. No pleural effusion. The cardiac silhouette, mediastinal  and hilar contours are unremarkable. Prominent skin fold over the left chest but no definite pneumothorax. A left lower lobe calcified granuloma is noted. Two views of the abdomen demonstrate moderate air scattered throughout the small bowel and colon which could suggest a mild diffuse ileus or gastroenteritis. No findings for small bowel obstruction or free air. The soft tissue shadows are maintained. Vascular calcifications are noted. The bony structures are intact. IMPRESSION: 1. Right hilar opacity, most likely pneumonia. Recommend post treatment follow-up chest x-ray in 3-4 weeks to make sure this resolves. 2. Possible mild ileus or gastroenteritis. No findings for obstruction or perforation. Electronically Signed   By: Marijo Sanes M.D.   On: 11/17/2015 09:34     Assessment and Plan   1. Multifocal atrial tachycardia: -Likely due to underlying lung disease -No signs of Afib on ECG or telemetry -No need for Heparin  -Has been titrated off Cardizem gtt on 12/1 and started on oral Cardizem 30 mg q 6 hours with heart rates running in low 100's mostly.   2. Sepsis in the setting of possible aspiration PNA: -On vancomycin and Zosyn per IM -Nebs per IM -Treatment per IM  3. Elevated troponin: -Mildly elevated  -Likely supply demand ischemia in the setting of his sepsis, PNA, leukocytosis, and rate related    Signed, Christell Faith, PA-C Pager: [phone] 11/19/2015, 10:54 AM

## 2015-11-20 ENCOUNTER — Encounter: Payer: Self-pay | Admitting: Radiology

## 2015-11-20 ENCOUNTER — Inpatient Hospital Stay: Payer: Medicare Other

## 2015-11-20 LAB — CBC
HEMATOCRIT: 33.2 % — AB (ref 40.0–52.0)
HEMOGLOBIN: 10.8 g/dL — AB (ref 13.0–18.0)
MCH: 32.6 pg (ref 26.0–34.0)
MCHC: 32.5 g/dL (ref 32.0–36.0)
MCV: 100.4 fL — AB (ref 80.0–100.0)
Platelets: 449 10*3/uL — ABNORMAL HIGH (ref 150–440)
RBC: 3.3 MIL/uL — AB (ref 4.40–5.90)
RDW: 18.5 % — ABNORMAL HIGH (ref 11.5–14.5)
WBC: 23.6 10*3/uL — AB (ref 3.8–10.6)

## 2015-11-20 LAB — VANCOMYCIN, TROUGH: Vancomycin Tr: 9 ug/mL — ABNORMAL LOW (ref 10–20)

## 2015-11-20 MED ORDER — SODIUM CHLORIDE 0.9 % IJ SOLN
3.0000 mL | INTRAMUSCULAR | Status: DC | PRN
  Administered 2015-11-22 – 2015-11-23 (×2): 3 mL via INTRAVENOUS
  Filled 2015-11-20 (×2): qty 10

## 2015-11-20 MED ORDER — IOHEXOL 300 MG/ML  SOLN
100.0000 mL | Freq: Once | INTRAMUSCULAR | Status: AC | PRN
  Administered 2015-11-20: 100 mL via INTRAVENOUS

## 2015-11-20 MED ORDER — PANTOPRAZOLE SODIUM 40 MG PO PACK
40.0000 mg | PACK | Freq: Every day | ORAL | Status: DC
  Administered 2015-11-20 – 2015-11-23 (×4): 40 mg
  Filled 2015-11-20 (×4): qty 20

## 2015-11-20 MED ORDER — IOHEXOL 240 MG/ML SOLN
25.0000 mL | INTRAMUSCULAR | Status: AC
  Administered 2015-11-20 (×2): 25 mL via ORAL

## 2015-11-20 MED ORDER — VANCOMYCIN HCL IN DEXTROSE 750-5 MG/150ML-% IV SOLN
750.0000 mg | Freq: Two times a day (BID) | INTRAVENOUS | Status: DC
  Administered 2015-11-20: 750 mg via INTRAVENOUS
  Filled 2015-11-20 (×2): qty 150

## 2015-11-20 MED ORDER — AMPICILLIN-SULBACTAM SODIUM 3 (2-1) G IJ SOLR
3.0000 g | Freq: Four times a day (QID) | INTRAMUSCULAR | Status: DC
  Filled 2015-11-20 (×3): qty 3

## 2015-11-20 MED ORDER — SODIUM CHLORIDE 0.9 % IV SOLN
1.5000 g | Freq: Four times a day (QID) | INTRAVENOUS | Status: DC
  Administered 2015-11-20 – 2015-11-22 (×9): 1.5 g via INTRAVENOUS
  Filled 2015-11-20 (×11): qty 1.5

## 2015-11-20 NOTE — Care Management Important Message (Signed)
Important Message  Patient Details  Name: Corey Hughes MRN: [id number] Date of Birth: 07-Jan-1941   Medicare Important Message Given:  Yes    [name] A, RN 11/20/2015, 5:46 PM

## 2015-11-20 NOTE — Progress Notes (Signed)
Key Points: Use following P&T approved IV to PO antibiotic change policy.  Description contains the criteria that are approved Note: Policy Excludes:  Esophagectomy patientsPHARMACIST - PHYSICIAN COMMUNICATION DR:   Posey Pronto CONCERNING: IV to Oral Route Change Policy  RECOMMENDATION: This patient is receiving pantoprazole by the intravenous route.  Based on criteria approved by the Pharmacy and Therapeutics Committee, the intravenous medication(s) is/are being converted to the equivalent oral dose form(s).   DESCRIPTION: These criteria include:  The patient is eating (either orally or via tube) and/or has been taking other orally administered medications for a least 24 hours  The patient has no evidence of active gastrointestinal bleeding or impaired GI absorption (gastrectomy, short bowel, patient on TNA or NPO).  If you have questions about this conversion, please contact the Pharmacy Department  []   [phone] )  [facility] [x]   [phone] )  [facility] []   [phone] )  [facility] []   [phone] )  [facility] []   [phone] )  [place] 11/20/2015 10:02 AM

## 2015-11-20 NOTE — Progress Notes (Addendum)
ANTIBIOTIC CONSULT NOTE - Follow up  Pharmacy Consult for Vancomycin/Unasyn Indication: Sepsis/HCAP  Allergies  Allergen Reactions  . Morphine And Related Shortness Of Breath   Patient Measurements: Height: 5\' 8"  (172.7 cm) Weight: 123 lb (55.792 kg) IBW/kg (Calculated) : 68.4  Vital Signs: Temp: 98.6 F (37 C) (12/04 0800) Temp Source: Oral (12/04 0800) BP: 108/52 mmHg (12/04 0800) Pulse Rate: 111 (12/04 0800) Intake/Output from previous day: 12/03 0701 - 12/04 0700 In: 925 [I.V.:625; IV Piggyback:300] Out: 1500 [Urine:1500] Intake/Output from this shift:    Recent Labs  11/18/15 0655 11/19/15 0448 11/20/15 0717  WBC 16.0* 18.4* 23.6*  HGB 11.0* 10.8* 10.8*  PLT 399 421 449*  CREATININE 0.95 1.01  --    Estimated Creatinine Clearance: 49.9 mL/min (by C-G formula based on Cr of 1.01).    Microbiology: Recent Results (from the past 720 hour(s))  Blood culture (routine x 2)     Status: None (Preliminary result)   Collection Time: 11/17/15  8:37 AM  Result Value Ref Range Status   Specimen Description BLOOD  Final   Special Requests   Final    BOTTLES DRAWN AEROBIC AND ANAEROBIC  LEFT HAND AEROBIC 4 ML ANAEROBIC 4 ML   Culture NO GROWTH 3 DAYS  Final   Report Status PENDING  Incomplete  Urine culture     Status: None   Collection Time: 11/17/15  8:37 AM  Result Value Ref Range Status   Specimen Description URINE, RANDOM  Final   Special Requests NONE  Final   Culture INSIGNIFICANT GROWTH  Final   Report Status 11/19/2015 FINAL  Final  Blood culture (routine x 2)     Status: None (Preliminary result)   Collection Time: 11/17/15  8:49 AM  Result Value Ref Range Status   Specimen Description BLOOD  Final   Special Requests   Final    BOTTLES DRAWN AEROBIC AND ANAEROBIC  LEFT AC AEROBIC 4ML ANAEROBIC 4 ML   Culture NO GROWTH 3 DAYS  Final   Report Status PENDING  Incomplete  MRSA PCR Screening     Status: None   Collection Time: 11/17/15  1:05 PM  Result  Value Ref Range Status   MRSA by PCR NEGATIVE NEGATIVE Final    Comment:        The GeneXpert MRSA Assay (FDA approved for NASAL specimens only), is one component of a comprehensive MRSA colonization surveillance program. It is not intended to diagnose MRSA infection nor to guide or monitor treatment for MRSA infections.     Medications:  Scheduled:  . antiseptic oral rinse  7 mL Mouth Rinse BID  . aspirin  325 mg Oral Daily  . carbidopa-levodopa  1 tablet Oral TID  . diltiazem  30 mg Oral 4 times per day  . enoxaparin (LOVENOX) injection  40 mg Subcutaneous Q24H  . iohexol  25 mL Oral Q1 Hr x 2  . ipratropium-albuterol  3 mL Nebulization TID  . metoprolol tartrate  25 mg Oral BID  . pantoprazole (PROTONIX) IV  40 mg Intravenous Q24H  . sodium chloride  3 mL Intravenous Q12H  . Vancomycin  750 mg Intravenous Q12H   Anti-infectives    Start     Dose/Rate Route Frequency Ordered Stop   11/20/15 1030  vancomycin (VANCOCIN) IVPB 750 mg/150 ml premix     750 mg 150 mL/hr over 60 Minutes Intravenous Every 12 hours 11/20/15 0948     11/18/15 2000  vancomycin (VANCOCIN) IVPB 750 mg/150  ml premix  Status:  Discontinued     750 mg 150 mL/hr over 60 Minutes Intravenous Every 18 hours 11/17/15 1157 11/20/15 0948   11/18/15 0100  vancomycin (VANCOCIN) IVPB 750 mg/150 ml premix     750 mg 150 mL/hr over 60 Minutes Intravenous  Once 11/17/15 1157 11/18/15 0300   11/17/15 1800  piperacillin-tazobactam (ZOSYN) IVPB 3.375 g  Status:  Discontinued     3.375 g 12.5 mL/hr over 240 Minutes Intravenous Every 8 hours 11/17/15 1157 11/20/15 0837   11/17/15 1030  piperacillin-tazobactam (ZOSYN) IVPB 3.375 g     3.375 g 100 mL/hr over 30 Minutes Intravenous  Once 11/17/15 1026 11/17/15 1059   11/17/15 1030  vancomycin (VANCOCIN) IVPB 1000 mg/200 mL premix     1,000 mg 200 mL/hr over 60 Minutes Intravenous  Once 11/17/15 1026 11/17/15 1150     Assessment: 74 yo Male with PMH of Parkinsons,  PNA, and COPD.  Patient with hospitalization 2 months ago for Sepsis.  Goal of Therapy:  Vancomycin trough level 15-20 mcg/ml  Plan:  Zosyn d/c, abx changed to vancomycin and Unasyn. Will begin Unasyn 1.5 g iv q 6 hours. Vancomycin trough is below goal at 9 so will increase vancomycin dose to 750 mg iv q 12 hours and check another trough with the 5th dose of this new regimen. Pharmacy will continue to monitor labs and renal function and make adjustments as needed.  Ulice Dash, PharmD   11/20/2015 9:48 AM

## 2015-11-20 NOTE — Progress Notes (Addendum)
Patient ID: Corey Hughes, male   DOB: October 14, 1941, 74 y.o.   MRN: [id number] [facility] PROGRESS NOTE  PCP: Adin Hector, MD  HPI/Subjective: Patient doing better more responsive according to the family. His WBC count is 23,000. No fevers    Objective: Filed Vitals:   11/20/15 0800 11/20/15 1124  BP: 108/52 103/58  Pulse: 111 88  Temp: 98.6 F (37 C) 97.3 F (36.3 C)  Resp: 20 17    Intake/Output Summary (Last 24 hours) at 11/20/15 1318 Last data filed at 11/20/15 0830  Gross per 24 hour  Intake    250 ml  Output   1500 ml  Net  -1250 ml   Filed Weights   11/17/15 0844  Weight: 55.792 kg (123 lb)    ROS: Review of Systems  Constitutional: Positive for fever. Negative for chills.  Eyes: Negative for blurred vision.  Respiratory: Positive for cough and shortness of breath.   Cardiovascular: Negative for chest pain.  Gastrointestinal: Negative for nausea, vomiting, abdominal pain, diarrhea and constipation.  Genitourinary: Negative for dysuria.  Musculoskeletal: Negative for joint pain.  Neurological: Negative for dizziness and headaches.   Exam: Physical Exam  HENT:  Nose: No mucosal edema.  Mouth/Throat: No oropharyngeal exudate or posterior oropharyngeal edema.  Eyes: Conjunctivae and lids are normal. Pupils are equal, round, and reactive to light.  Did not look to the left for me.  Neck: No JVD present. Carotid bruit is not present. No edema present. No thyroid mass and no thyromegaly present.  Cardiovascular: S1 normal and S2 normal.  Exam reveals no gallop.   Murmur heard.  Systolic murmur is present with a grade of 2/6  Pulses:      Dorsalis pedis pulses are 2+ on the right side, and 2+ on the left side.  Respiratory: No respiratory distress. He has no wheezes. He has rhonchi in the right lower field and the left lower field. He has no rales.  GI: Soft. Bowel sounds are normal. There is no tenderness.  Musculoskeletal:       Right  ankle: He exhibits swelling.       Left ankle: He exhibits swelling.  Lymphadenopathy:    He has no cervical adenopathy.  Neurological: He is alert.  Patient did not want to look to the left. He is able to lift up both arms on his own. He tries to lift up his legs up off the bed but was not able to do it too well.  Skin: Skin is warm. Nails show no clubbing.  Bilateral feet and some reddish blackish discoloration on the toes bilaterally  Psychiatric: His affect is blunt. His speech is delayed.    Data Reviewed: Basic Metabolic Panel:  Recent Labs Lab 11/17/15 0852 11/18/15 0655 11/19/15 0448  NA 133* 131* 131*  K 4.2 3.3* 3.1*  CL 95* 100* 101  CO2 28 24 23   GLUCOSE 111* 145* 118*  BUN 32* 26* 22*  CREATININE 1.04 0.95 1.01  CALCIUM 8.7* 7.5* 7.5*   Liver Function Tests:  Recent Labs Lab 11/17/15 0852  AST 34  ALT 21  ALKPHOS 135*  BILITOT 0.7  PROT 7.1  ALBUMIN 2.4*   CBC:  Recent Labs Lab 11/17/15 0852 11/18/15 0655 11/19/15 0448 11/20/15 0717  WBC 17.8* 16.0* 18.4* 23.6*  NEUTROABS 15.1*  --   --   --   HGB 14.3 11.0* 10.8* 10.8*  HCT 44.1 33.9* 32.5* 33.2*  MCV 102.7* 102.1* 101.4* 100.4*  PLT 466* 399 421 449*   Cardiac Enzymes:  Recent Labs Lab 11/17/15 0852 11/17/15 1558 11/17/15 2147 11/18/15 0559  TROPONINI 0.19* 0.04* 0.03 0.05*     Recent Results (from the past 240 hour(s))  Blood culture (routine x 2)     Status: None (Preliminary result)   Collection Time: 11/17/15  8:37 AM  Result Value Ref Range Status   Specimen Description BLOOD  Final   Special Requests   Final    BOTTLES DRAWN AEROBIC AND ANAEROBIC  LEFT HAND AEROBIC 4 ML ANAEROBIC 4 ML   Culture NO GROWTH 3 DAYS  Final   Report Status PENDING  Incomplete  Urine culture     Status: None   Collection Time: 11/17/15  8:37 AM  Result Value Ref Range Status   Specimen Description URINE, RANDOM  Final   Special Requests NONE  Final   Culture INSIGNIFICANT GROWTH  Final    Report Status 11/19/2015 FINAL  Final  Blood culture (routine x 2)     Status: None (Preliminary result)   Collection Time: 11/17/15  8:49 AM  Result Value Ref Range Status   Specimen Description BLOOD  Final   Special Requests   Final    BOTTLES DRAWN AEROBIC AND ANAEROBIC  LEFT AC AEROBIC 4ML ANAEROBIC 4 ML   Culture NO GROWTH 3 DAYS  Final   Report Status PENDING  Incomplete  MRSA PCR Screening     Status: None   Collection Time: 11/17/15  1:05 PM  Result Value Ref Range Status   MRSA by PCR NEGATIVE NEGATIVE Final    Comment:        The GeneXpert MRSA Assay (FDA approved for NASAL specimens only), is one component of a comprehensive MRSA colonization surveillance program. It is not intended to diagnose MRSA infection nor to guide or monitor treatment for MRSA infections.      Studies: No results found.  Scheduled Meds: . ampicillin-sulbactam (UNASYN) IV  1.5 g Intravenous Q6H  . antiseptic oral rinse  7 mL Mouth Rinse BID  . aspirin  325 mg Oral Daily  . carbidopa-levodopa  1 tablet Oral TID  . diltiazem  30 mg Oral 4 times per day  . enoxaparin (LOVENOX) injection  40 mg Subcutaneous Q24H  . ipratropium-albuterol  3 mL Nebulization TID  . metoprolol tartrate  25 mg Oral BID  . pantoprazole sodium  40 mg Per Tube Daily  . sodium chloride  3 mL Intravenous Q12H  . Vancomycin  750 mg Intravenous Q12H   Continuous Infusions:    Assessment/Plan:  1. Clinical sepsis. Due to aspiration pneumonia in the right lung. WBC count is even higher today.  We'll get a CT of the chest and abdomen. Change to Unasyn. Vancomycin  2. Atrial fibrillation with rapid ventricular response-continue oral Cardizem heart rate is stable 3. Dysphagia- patient was put on dysphagia diet by speech therapy. Overall prognosis is poor and palliative care consultation appreciated 4. Elevated troponin demand ischemia from sepsis and atrial fibrillation with rapid ventricular rate. 5. Parkinson's  with dementia-  Sinemet. Appreciate palliative care consultation 6. Acute encephalopathy- likely related to aspiration pneumonia 7. Acute respiratory failure with hypoxia- from pneumonia. Continue oxygen supplementation  Code Status:     Code Status Orders        Start     Ordered   11/18/15 1452  Do not attempt resuscitation (DNR)   Continuous    Question Answer Comment  In the event of cardiac  or respiratory ARREST Do not call a "code blue"   In the event of cardiac or respiratory ARREST Do not perform Intubation, CPR, defibrillation or ACLS   In the event of cardiac or respiratory ARREST Use medication by any route, position, wound care, and other measures to relive pain and suffering. May use oxygen, suction and manual treatment of airway obstruction as needed for comfort.   Comments DNR and DNI and NO FEEDING TUBES      11/18/15 1451     Family Communication: Wife and daughter at the bedside. Palliative care made a DO NOT RESUSCITATE Disposition Plan: Potentially home with hospice  Consultants:  Palliative care team  Antibiotics:  Vancomycin  Unasyn  Time spent: 30 minutes  [place], [facility]  [facility]

## 2015-11-21 LAB — CBC WITH DIFFERENTIAL/PLATELET
Basophils Absolute: 0 10*3/uL (ref 0–0.1)
Basophils Relative: 0 %
EOS PCT: 1 %
Eosinophils Absolute: 0.1 10*3/uL (ref 0–0.7)
HEMATOCRIT: 35.2 % — AB (ref 40.0–52.0)
Hemoglobin: 11.4 g/dL — ABNORMAL LOW (ref 13.0–18.0)
LYMPHS ABS: 0.7 10*3/uL — AB (ref 1.0–3.6)
LYMPHS PCT: 3 %
MCH: 32.6 pg (ref 26.0–34.0)
MCHC: 32.4 g/dL (ref 32.0–36.0)
MCV: 100.5 fL — AB (ref 80.0–100.0)
MONO ABS: 1.5 10*3/uL — AB (ref 0.2–1.0)
MONOS PCT: 7 %
Neutro Abs: 20 10*3/uL — ABNORMAL HIGH (ref 1.4–6.5)
Neutrophils Relative %: 89 %
PLATELETS: 569 10*3/uL — AB (ref 150–440)
RBC: 3.51 MIL/uL — ABNORMAL LOW (ref 4.40–5.90)
RDW: 18 % — AB (ref 11.5–14.5)
WBC: 22.4 10*3/uL — AB (ref 3.8–10.6)

## 2015-11-21 LAB — BASIC METABOLIC PANEL
Anion gap: 8 (ref 5–15)
BUN: 31 mg/dL — AB (ref 6–20)
CALCIUM: 7.8 mg/dL — AB (ref 8.9–10.3)
CO2: 27 mmol/L (ref 22–32)
Chloride: 101 mmol/L (ref 101–111)
Creatinine, Ser: 1.01 mg/dL (ref 0.61–1.24)
GFR calc Af Amer: >60 mL/min (ref >60)
GLUCOSE: 101 mg/dL — AB (ref 65–99)
Potassium: 4.2 mmol/L (ref 3.5–5.1)
Sodium: 136 mmol/L (ref 135–145)

## 2015-11-21 NOTE — Progress Notes (Signed)
Patient ID: Corey Hughes, male   DOB: 25-May-1941, 74 y.o.   MRN: [id number] [facility] PROGRESS NOTE  PCP: Tama High III, MD  HPI/Subjective: Patient had a CT scan of the chest which did show multifocal pneumonia suggestive of recurrent aspiration    Objective: Filed Vitals:   11/21/15 0813 11/21/15 1144  BP: 127/57 111/50  Pulse: 52 72  Temp: 98 F (36.7 C) 97.5 F (36.4 C)  Resp: 15 20    Intake/Output Summary (Last 24 hours) at 11/21/15 1509 Last data filed at 11/21/15 1353  Gross per 24 hour  Intake    600 ml  Output    425 ml  Net    175 ml   Filed Weights   11/17/15 0844  Weight: 55.792 kg (123 lb)    ROS: Review of Systems  Constitutional: No fever. Negative for chills.  Eyes: Negative for blurred vision.  Respiratory: Positive for cough and shortness of breath.   Cardiovascular: Negative for chest pain.  Gastrointestinal: Negative for nausea, vomiting, abdominal pain, diarrhea and constipation.  Genitourinary: Negative for dysuria.  Musculoskeletal: Negative for joint pain.  Neurological: Negative for dizziness and headaches.   Exam: Physical Exam  HENT:  Nose: No mucosal edema.  Mouth/Throat: No oropharyngeal exudate or posterior oropharyngeal edema.  Eyes: Conjunctivae and lids are normal. Pupils are equal, round, and reactive to light.  Did not look to the left for me.  Neck: No JVD present. Carotid bruit is not present. No edema present. No thyroid mass and no thyromegaly present.  Cardiovascular: S1 normal and S2 normal.  Exam reveals no gallop.   Murmur heard.  Systolic murmur is present with a grade of 2/6  Pulses:      Dorsalis pedis pulses are 2+ on the right side, and 2+ on the left side.  Respiratory: No respiratory distress. He has no wheezes. Diminished breath sounds GI: Soft. Bowel sounds are normal. There is no tenderness.  Musculoskeletal:       Right ankle: He exhibits swelling.       Left ankle: He exhibits  swelling.  Lymphadenopathy:    He has no cervical adenopathy.  Neurological: He is alert.  Patient did not want to look to the left. He is able to lift up both arms on his own. He tries to lift up his legs up off the bed but was not able to do it too well.  Skin: Skin is warm. Nails show no clubbing.  Bilateral feet and some reddish blackish discoloration on the toes bilaterally  Psychiatric: His affect is blunt. His speech is delayed.    Data Reviewed: Basic Metabolic Panel:  Recent Labs Lab 11/17/15 0852 11/18/15 0655 11/19/15 0448 11/21/15 0905  NA 133* 131* 131* 136  K 4.2 3.3* 3.1* 4.2  CL 95* 100* 101 101  CO2 28 24 23 27   GLUCOSE 111* 145* 118* 101*  BUN 32* 26* 22* 31*  CREATININE 1.04 0.95 1.01 1.01  CALCIUM 8.7* 7.5* 7.5* 7.8*   Liver Function Tests:  Recent Labs Lab 11/17/15 0852  AST 34  ALT 21  ALKPHOS 135*  BILITOT 0.7  PROT 7.1  ALBUMIN 2.4*   CBC:  Recent Labs Lab 11/17/15 0852 11/18/15 0655 11/19/15 0448 11/20/15 0717 11/21/15 0905  WBC 17.8* 16.0* 18.4* 23.6* 22.4*  NEUTROABS 15.1*  --   --   --  20.0*  HGB 14.3 11.0* 10.8* 10.8* 11.4*  HCT 44.1 33.9* 32.5* 33.2* 35.2*  MCV  102.7* 102.1* 101.4* 100.4* 100.5*  PLT 466* 399 421 449* 569*   Cardiac Enzymes:  Recent Labs Lab 11/17/15 0852 11/17/15 1558 11/17/15 2147 11/18/15 0559  TROPONINI 0.19* 0.04* 0.03 0.05*     Recent Results (from the past 240 hour(s))  Blood culture (routine x 2)     Status: None (Preliminary result)   Collection Time: 11/17/15  8:37 AM  Result Value Ref Range Status   Specimen Description BLOOD  Final   Special Requests   Final    BOTTLES DRAWN AEROBIC AND ANAEROBIC  LEFT HAND AEROBIC 4 ML ANAEROBIC 4 ML   Culture NO GROWTH 4 DAYS  Final   Report Status PENDING  Incomplete  Urine culture     Status: None   Collection Time: 11/17/15  8:37 AM  Result Value Ref Range Status   Specimen Description URINE, RANDOM  Final   Special Requests NONE  Final    Culture INSIGNIFICANT GROWTH  Final   Report Status 11/19/2015 FINAL  Final  Blood culture (routine x 2)     Status: None (Preliminary result)   Collection Time: 11/17/15  8:49 AM  Result Value Ref Range Status   Specimen Description BLOOD  Final   Special Requests   Final    BOTTLES DRAWN AEROBIC AND ANAEROBIC  LEFT AC AEROBIC 4ML ANAEROBIC 4 ML   Culture NO GROWTH 4 DAYS  Final   Report Status PENDING  Incomplete  MRSA PCR Screening     Status: None   Collection Time: 11/17/15  1:05 PM  Result Value Ref Range Status   MRSA by PCR NEGATIVE NEGATIVE Final    Comment:        The GeneXpert MRSA Assay (FDA approved for NASAL specimens only), is one component of a comprehensive MRSA colonization surveillance program. It is not intended to diagnose MRSA infection nor to guide or monitor treatment for MRSA infections.   Culture, blood (routine x 2)     Status: None (Preliminary result)   Collection Time: 11/20/15  9:10 AM  Result Value Ref Range Status   Specimen Description BLOOD RIGHT AC  Final   Special Requests   Final    BOTTLES DRAWN AEROBIC AND ANAEROBIC  AER 5CC ANA 2CC   Culture NO GROWTH < 24 HOURS  Final   Report Status PENDING  Incomplete  Culture, blood (routine x 2)     Status: None (Preliminary result)   Collection Time: 11/20/15  9:24 AM  Result Value Ref Range Status   Specimen Description BLOOD LEFT HAND  Final   Special Requests BOTTLES DRAWN AEROBIC AND ANAEROBIC  6CC  Final   Culture NO GROWTH < 24 HOURS  Final   Report Status PENDING  Incomplete     Studies: Ct Chest W Contrast  11/20/2015  CLINICAL DATA:  Patient with pneumonia and leukocytosis. History of melanoma. EXAM: CT CHEST WITH CONTRAST CT ABDOMEN AND PELVIS WITH CONTRAST TECHNIQUE: Multidetector CT imaging of the chest was performed during intravenous contrast administration. Multidetector CT imaging of the abdomen and pelvis was performed following the standard protocol after bolus administration  of intravenous contrast. CONTRAST:  120mL OMNIPAQUE IOHEXOL 300 MG/ML  SOLN COMPARISON:  Chest radiograph 11/17/2015 FINDINGS: CT CHEST FINDINGS Mediastinum/Lymph Nodes: There are modestly enlarged mediastinal lymph nodes bilaterally, likely reactive. The heart is normal in size. There is atherosclerotic disease of the coronary arteries and aorta. Mitral valve annular calcifications are seen. Lungs/Pleura: There are bilateral pleural effusions with compressive dependent atelectasis  in the lung bases. There is dense airspace consolidation of the right upper lobe and less so left upper lobe. Smaller patchy areas of airspace consolidation are also seen within the right middle lobe, right lower lobe, lingula and left lower lobe. There is background of emphysematous and chronic interstitial lung changes. Musculoskeletal: No chest wall mass or suspicious bone lesions identified. CT ABDOMEN PELVIS FINDINGS Hepatobiliary: There are 2 less than 4 mm hypoattenuated lesions within the left lobe of the liver, too small to be accurately characterize by CT. There is a sliver of perihepatic and pericholecystic free fluid. Pancreas: No mass, inflammatory changes, or other significant abnormality. Spleen: Within normal limits in size and appearance. Adrenals/Urinary Tract: No masses identified. No evidence of hydronephrosis. There is a 12 mm left renal cyst. There is nonspecific perinephric fat stranding. Stomach/Bowel: No evidence of obstruction, inflammatory process, or abnormal fluid collections. There is left colonic diverticulosis without evidence of diverticulitis. Vascular/Lymphatic: No pathologically enlarged lymph nodes. No evidence of abdominal aortic aneurysm. Atherosclerotic disease of the aorta and common iliac arteries is seen. Reproductive: No mass or other significant abnormality. The urinary bladder is decompressed around urinary Foley. Other: None. Musculoskeletal: No suspicious bone lesions identified. There are  multilevel osteoarthritic changes of the lumbosacral spine. IMPRESSION: Multifocal bilateral pneumonia, worse in the left upper lobe. Bilateral pleural effusions. Obstructing endobronchial lesion cannot be excluded, although one was not seen. Follow-up to resolution is recommended. Probable reactive mediastinal lymphadenopathy. Small amount of free fluid in the abdomen, mostly in perihepatic location. Left colonic diverticulosis without evidence of diverticulitis. Atherosclerotic disease of the aorta. Electronically Signed   By: Fidela Salisbury M.D.   On: 11/20/2015 14:28   Ct Abdomen Pelvis W Contrast  11/20/2015  CLINICAL DATA:  Patient with pneumonia and leukocytosis. History of melanoma. EXAM: CT CHEST WITH CONTRAST CT ABDOMEN AND PELVIS WITH CONTRAST TECHNIQUE: Multidetector CT imaging of the chest was performed during intravenous contrast administration. Multidetector CT imaging of the abdomen and pelvis was performed following the standard protocol after bolus administration of intravenous contrast. CONTRAST:  176mL OMNIPAQUE IOHEXOL 300 MG/ML  SOLN COMPARISON:  Chest radiograph 11/17/2015 FINDINGS: CT CHEST FINDINGS Mediastinum/Lymph Nodes: There are modestly enlarged mediastinal lymph nodes bilaterally, likely reactive. The heart is normal in size. There is atherosclerotic disease of the coronary arteries and aorta. Mitral valve annular calcifications are seen. Lungs/Pleura: There are bilateral pleural effusions with compressive dependent atelectasis in the lung bases. There is dense airspace consolidation of the right upper lobe and less so left upper lobe. Smaller patchy areas of airspace consolidation are also seen within the right middle lobe, right lower lobe, lingula and left lower lobe. There is background of emphysematous and chronic interstitial lung changes. Musculoskeletal: No chest wall mass or suspicious bone lesions identified. CT ABDOMEN PELVIS FINDINGS Hepatobiliary: There are 2 less  than 4 mm hypoattenuated lesions within the left lobe of the liver, too small to be accurately characterize by CT. There is a sliver of perihepatic and pericholecystic free fluid. Pancreas: No mass, inflammatory changes, or other significant abnormality. Spleen: Within normal limits in size and appearance. Adrenals/Urinary Tract: No masses identified. No evidence of hydronephrosis. There is a 12 mm left renal cyst. There is nonspecific perinephric fat stranding. Stomach/Bowel: No evidence of obstruction, inflammatory process, or abnormal fluid collections. There is left colonic diverticulosis without evidence of diverticulitis. Vascular/Lymphatic: No pathologically enlarged lymph nodes. No evidence of abdominal aortic aneurysm. Atherosclerotic disease of the aorta and common iliac arteries is seen.  Reproductive: No mass or other significant abnormality. The urinary bladder is decompressed around urinary Foley. Other: None. Musculoskeletal: No suspicious bone lesions identified. There are multilevel osteoarthritic changes of the lumbosacral spine. IMPRESSION: Multifocal bilateral pneumonia, worse in the left upper lobe. Bilateral pleural effusions. Obstructing endobronchial lesion cannot be excluded, although one was not seen. Follow-up to resolution is recommended. Probable reactive mediastinal lymphadenopathy. Small amount of free fluid in the abdomen, mostly in perihepatic location. Left colonic diverticulosis without evidence of diverticulitis. Atherosclerotic disease of the aorta. Electronically Signed   By: Fidela Salisbury M.D.   On: 11/20/2015 14:28    Scheduled Meds: . ampicillin-sulbactam (UNASYN) IV  1.5 g Intravenous Q6H  . aspirin  325 mg Oral Daily  . carbidopa-levodopa  1 tablet Oral TID  . diltiazem  30 mg Oral 4 times per day  . enoxaparin (LOVENOX) injection  40 mg Subcutaneous Q24H  . ipratropium-albuterol  3 mL Nebulization TID  . metoprolol tartrate  25 mg Oral BID  . pantoprazole  sodium  40 mg Per Tube Daily   Continuous Infusions:    Assessment/Plan:  1. Clinical sepsis. Due to aspiration pneumonia multifocal WBC count continues to be elevated I have discussed with the family regarding this situation and patient's third admission for same problem. I have discussed this with speech as well were changed to liquid consistency and then he'll have a modified barium tomorrow. If he continues to show aspiration with the change in the diet then will need PEG tube which trichomoniasis nothing by mouth status for comfort care. Family is leaning more towards PEG tube  2. Atrial fibrillation with rapid ventricular response-continue oral Cardizem heart rate is stable 3. Dysphagia- patient was put on dysphagia diet by speech therapy. Overall prognosis is poor and palliative care consultation appreciated 4. Elevated troponin demand ischemia from sepsis and atrial fibrillation with rapid ventricular rate. 5. Parkinson's with dementia-  Sinemet. Appreciate palliative care consultation 6. Acute encephalopathy- likely related to aspiration pneumonia 7. Acute respiratory failure with hypoxia- from pneumonia. Continue oxygen supplementation  Code Status:     Code Status Orders        Start     Ordered   11/18/15 1452  Do not attempt resuscitation (DNR)   Continuous    Question Answer Comment  In the event of cardiac or respiratory ARREST Do not call a "code blue"   In the event of cardiac or respiratory ARREST Do not perform Intubation, CPR, defibrillation or ACLS   In the event of cardiac or respiratory ARREST Use medication by any route, position, wound care, and other measures to relive pain and suffering. May use oxygen, suction and manual treatment of airway obstruction as needed for comfort.   Comments DNR and DNI and NO FEEDING TUBES      11/18/15 1451     Family Communication: Wife and daughter at the bedside. Palliative care made a DO NOT RESUSCITATE Disposition  Plan: Potentially home with hospice  Consultants:  Palliative care team  Antibiotics:  Vancomycin  Unasyn  Time spent: 30 minutes  [place], [facility]  [facility]

## 2015-11-21 NOTE — Progress Notes (Signed)
Writer to patient's room to follow up on referral for [facility] services after discharge. Patient was alone in the room, no family present. Writer attempted to contact patient's wife Hoyle Sauer on both numbers listed, no answer. Writer also spoke with attending physician Dr. Posey Pronto who reports that in his discussion with family today there was no mention of hospice and family may want PEG tube placement. Plan is for patient to have a modified swallow evaluation in the morning. Plan to follow up family on 12/6. CMRN Lyn made aware that hospice services have not been presented at this time. Thank you. Flo Shanks RN, BSN, [facility], [facility] [phone] c

## 2015-11-21 NOTE — Care Management Note (Addendum)
Case Management Note  Patient Details  Name: Corey Hughes MRN: [id number] Date of Birth: 09-07-1941  Subjective/Objective:    Per Dr Hale Bogus note the family is requesting home with hospice services after hospital discharge. Provided Mrs Biggio with a list of area [place] providers today and she chose [facility]. Call and updated Flo Shanks of [facility] of family's wishes.Flo Shanks from [facility] will discuss Hospice discharge planning with Mrs Foringer today.                  Action/Plan:   Expected Discharge Date:                  Expected Discharge Plan:     In-House Referral:     Discharge planning Services     Post Acute Care Choice:    Choice offered to:     DME Arranged:    DME Agency:     HH Arranged:    [place] Agency:     Status of Service:     Medicare Important Message Given:  Yes Date Medicare IM Given:    Medicare IM give by:    Date Additional Medicare IM Given:    Additional Medicare Important Message give by:     If discussed at [place] of Stay Meetings, dates discussed:    Additional Comments:  Aigner Horseman A, RN 11/21/2015, 9:08 AM

## 2015-11-21 NOTE — Progress Notes (Signed)
Speech Therapy Note: MD consulted SLP re: pt's status stating continued suspicion of aspiration w/ the dysphagia diet. MD agreed w/ the option of moving to Honey consistency liquids from Nectar consistency vs npo w/ possible f/u w/ objective swallow study tomorrow. Noted in chart review and discussion w/ Hospice Nurse, pt may be discharging tomorrow w/ Hospice services d/t his declining status. ST will f/u tomorrow w/ MBSS if requested by family. Rec. Continue w/ strict aspiration precautions w/ current dysphagia diet.

## 2015-11-21 NOTE — Progress Notes (Signed)
Pt had run of SVT at 1143, MD made aware. No new orders at this time

## 2015-11-21 NOTE — Consult Note (Signed)
Palliative Medicine Inpatient Consult Note   Name: Corey Hughes Date: 11/21/2015 MRN: [id number]  DOB: 01-06-1941  Referring Physician: Dustin Flock, MD  Palliative Care consult requested for this 74 y.o. male for goals of medical therapy in patient with sepsis due to aspiration pneumonia of right lung.      TODAY'S DISCUSSION AND DECISIONS: 1.  Pt is changed to DNR status after discussion with family (and pt)  2.  Pt's wife is Media planner. She states she has HCPOA status and that pt also has a Living Will.  She is encouraged to bring this up here.  They like to make decisions as a family (including adult son who lives with his parents and daughter, who is present for this discussion).  But, pts wife is adamant about DNR and this decision is entered --and she will explain it to her son.  3.  Pt and family favor [facility] setting over other scenarios for care.  They do not want him in a nursing facility if this can be avoided. They would be happy to have him in [facility] if he gets worse and appears to have little time left.  The plan is to see how he does over the weekend and then re-assess which setting would be best for him.     IMPRESSION Sepsis Aspiration Pneumonia Dysphagia ---with aspiration --likely to be recurrent Atrial Fibrillation with RVR ---now changed to oral Cardizem (BUT have to see if he swallows pills/ capsules well enough to keep current RC --he might need shorter acting Cardizem 30 mg pills throughout the day if he does not get the long acting capsule down easily) Elevated Troponins due to demand ischemia (not an MI) Parkinson's with Parkinson's Dementia Acute Metabolic Encephalopathy --due to illness Acute Respiratory Failure with Hypoxia due to pneumonia  ---on oxygen Moderate Malnutrition  ---associated with advancing dementia with loss of appetite and dysphagia (Alb 2.4)  Leukocytosis due to pneumonia and sepsis Thrombocytosis (acute  phase reactant?)    REVIEW OF SYSTEMS:  Patient is not able to provide ROS due to illness  SPIRITUAL SUPPORT SYSTEM: Yes --family.  SOCIAL HISTORY:  reports that he has been smoking.  He does not have any smokeless tobacco history on file. He reports that he drinks alcohol. He reports that he does not use illicit drugs.  Married and has two adult children.   LEGAL DOCUMENTS:  Portable DNR form is placed in the paper chart  CODE STATUS: DNR as of 11/19/15  PAST MEDICAL HISTORY: Past Medical History  Diagnosis Date  . Chronic pain   . BPH (benign prostatic hyperplasia)   . Melanoma ([place])   . Tobacco abuse   . Pneumonia February 2013  . 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 Anemia   . Depression   . 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 Skin surgery      for melanoma    ALLERGIES:  is allergic to morphine and related.  MEDICATIONS:  Current Facility-Administered Medications  Medication Dose Route Frequency Provider Last Rate Last Dose  . ampicillin-sulbactam (UNASYN) 1.5 g in sodium chloride 0.9 % 50 mL IVPB  1.5 g Intravenous Q6H Dustin Flock, MD   1.5 g at 11/21/15 1134  . aspirin tablet 325 mg  325 mg Oral Daily Wellington Hampshire, MD   325 mg at 11/21/15  [id number]  . carbidopa-levodopa (SINEMET IR) 25-100 MG per tablet immediate release 1 tablet  1 tablet Oral TID Loletha Grayer, MD   1 tablet at 11/21/15 [phone]  . diltiazem (CARDIZEM) injection 5 mg  5 mg Intravenous Q6H PRN Dustin Flock, MD      . diltiazem (CARDIZEM) tablet 30 mg  30 mg Oral 4 times per day Wellington Hampshire, MD   30 mg at 11/21/15 1134  . enoxaparin (LOVENOX) injection 40 mg  40 mg Subcutaneous Q24H Laverle Hobby, MD   40 mg at 11/21/15 1134  . ipratropium-albuterol (DUONEB) 0.5-2.5 (3) MG/3ML nebulizer solution 3 mL  3 mL Nebulization TID Loletha Grayer, MD   3 mL at 11/21/15 0835   . metoprolol tartrate (LOPRESSOR) tablet 25 mg  25 mg Oral BID Dustin Flock, MD   25 mg at 11/21/15 0816  . ondansetron (ZOFRAN) tablet 4 mg  4 mg Oral Q6H PRN Theodoro Grist, MD       Or  . ondansetron (ZOFRAN) injection 4 mg  4 mg Intravenous Q6H PRN Theodoro Grist, MD      . pantoprazole sodium (PROTONIX) 40 mg/20 mL oral suspension 40 mg  40 mg Per Tube Daily Dustin Flock, MD   40 mg at 11/21/15 0817  . sodium chloride 0.9 % injection 3 mL  3 mL Intravenous PRN Fritzi Mandes, MD        Vital Signs: BP 127/57 mmHg  Pulse 52  Temp(Src) 98 F (36.7 C) (Oral)  Resp 15  Ht 5\' 8"  (1.727 m)  Wt 55.792 kg (123 lb)  BMI 18.71 kg/m2  SpO2 94% Filed Weights   11/17/15 0844  Weight: 55.792 kg (123 lb)    Estimated body mass index is 18.71 kg/(m^2) as calculated from the following:   Height as of this encounter: 5\' 8"  (1.727 m).   Weight as of this encounter: 55.792 kg (123 lb).  PERFORMANCE STATUS (ECOG) : 4 - Bedbound  PHYSICAL EXAM: Weak appearing in ICU bed Eyelids are weak EOMI OP clear Hrt irreg irreg no m Lungs with rales bases Abd soft and NT Ext no mottling or cyanosis      More than 50% of the visit was spent in counseling/coordination of care: Yes  Time Spent: 80 minutes

## 2015-11-22 ENCOUNTER — Inpatient Hospital Stay: Payer: Medicare Other

## 2015-11-22 LAB — CBC WITH DIFFERENTIAL/PLATELET
Basophils Absolute: 0 10*3/uL (ref 0–0.1)
Basophils Relative: 0 %
Eosinophils Absolute: 0 10*3/uL (ref 0–0.7)
Eosinophils Relative: 0 %
HCT: 34.8 % — ABNORMAL LOW (ref 40.0–52.0)
Hemoglobin: 11.4 g/dL — ABNORMAL LOW (ref 13.0–18.0)
Lymphocytes Relative: 3 %
Lymphs Abs: 0.6 10*3/uL — ABNORMAL LOW (ref 1.0–3.6)
MCH: 32.6 pg (ref 26.0–34.0)
MCHC: 32.8 g/dL (ref 32.0–36.0)
MCV: 99.2 fL (ref 80.0–100.0)
Monocytes Absolute: 1.3 10*3/uL — ABNORMAL HIGH (ref 0.2–1.0)
Monocytes Relative: 6 %
Neutro Abs: 19.3 10*3/uL — ABNORMAL HIGH (ref 1.4–6.5)
Neutrophils Relative %: 91 %
Platelets: 659 10*3/uL — ABNORMAL HIGH (ref 150–440)
RBC: 3.51 MIL/uL — ABNORMAL LOW (ref 4.40–5.90)
RDW: 17.9 % — ABNORMAL HIGH (ref 11.5–14.5)
WBC: 21.3 10*3/uL — ABNORMAL HIGH (ref 3.8–10.6)

## 2015-11-22 LAB — CULTURE, BLOOD (ROUTINE X 2)
Culture: NO GROWTH
Culture: NO GROWTH

## 2015-11-22 LAB — BASIC METABOLIC PANEL WITH GFR
Anion gap: 8 (ref 5–15)
BUN: 37 mg/dL — ABNORMAL HIGH (ref 6–20)
CO2: 26 mmol/L (ref 22–32)
Calcium: 7.6 mg/dL — ABNORMAL LOW (ref 8.9–10.3)
Chloride: 100 mmol/L — ABNORMAL LOW (ref 101–111)
Creatinine, Ser: 1.1 mg/dL (ref 0.61–1.24)
GFR calc Af Amer: >60 mL/min
GFR calc non Af Amer: >60 mL/min
Glucose, Bld: 121 mg/dL — ABNORMAL HIGH (ref 65–99)
Potassium: 3.3 mmol/L — ABNORMAL LOW (ref 3.5–5.1)
Sodium: 134 mmol/L — ABNORMAL LOW (ref 135–145)

## 2015-11-22 MED ORDER — POTASSIUM CHLORIDE CRYS ER 20 MEQ PO TBCR
40.0000 meq | EXTENDED_RELEASE_TABLET | Freq: Once | ORAL | Status: AC
  Administered 2015-11-22: 40 meq via ORAL
  Filled 2015-11-22: qty 2

## 2015-11-22 MED ORDER — AMPICILLIN-SULBACTAM SODIUM 3 (2-1) G IJ SOLR
3.0000 g | Freq: Four times a day (QID) | INTRAMUSCULAR | Status: DC
  Administered 2015-11-22 – 2015-11-23 (×4): 3 g via INTRAVENOUS
  Filled 2015-11-22 (×6): qty 3

## 2015-11-22 NOTE — Progress Notes (Signed)
Speech Language Pathology Treatment: Dysphagia  Patient Details Name: Corey Hughes MRN: [id number] DOB: [birth year] Today's Date: 11/22/2015 Time: 8309-4076 SLP Time Calculation (min) (ACUTE ONLY): 45 min  Assessment / Plan / Recommendation Clinical Impression  Met w/ wife during this session for education and training on the recommended dysphagia 1 w/ Honey consistency liquids diet. Aspiration precautions discussed w/ reviewed w/ Wife; unsure of pt's level of comprehension. Handouts given on precautions and the diet consistency preparation; food options and suggestions. Gave information on a dysphagia drink cup and ordering information for the thickened liquids as well. Wife demo. Verbal understanding of the information and education provided. Drinks/supplies provided for take home for pt's pending d/c home tomorrow. Wife thanked SLP for the education, information and supplies.    HPI HPI: Pt is a 74 y.o. male history of chronic pain, old stroke, depresssion, melanoma, pneumonia, melanoma, Parkinson's Dis. w/ Dementia presenting with fever, nausea and vomiting. Patient has been vomiting and has poor appetite for the last 4 days. Fever 100.4 as per the wife yesterday. She offered him tylenol but he spit it up. He is weaker than usual and less responsive. He was admitted 2 months ago for sepsis of unknown origin. Concern for possible ileus. Pt responds to basic questions only; less verbal output. Pt has not been consistently tolerating his initial dysphagia diet w/ Nectar consistency liquids per MD, and the diet was downgraded to Honey consistency liquids; he appears to be tolerating this diet. Pt requires strict aspiration precautions.       SLP Plan  Continue with current plan of care     Recommendations  Diet recommendations: Dysphagia 1 (puree);Honey-thick liquid Liquids provided via: Teaspoon Medication Administration: Crushed with puree Supervision: Staff to assist with self  feeding;Full supervision/cueing for compensatory strategies Compensations: Minimize environmental distractions;Slow rate;Small sips/bites;Follow solids with liquid;Multiple dry swallows after each bite/sip;Chin tuck Postural Changes and/or Swallow Maneuvers: Seated upright 90 degrees              General recommendations:  (Pt discharging home w/ Hospice) Oral Care Recommendations: Oral care BID;Staff/trained caregiver to provide oral care Follow up Recommendations:  (Home w/ Hospice services) Plan: Continue with current plan of care    Orinda Kenner, [place], CCC-SLP  Corey Hughes,Corey Hughes 11/22/2015, 4:01 PM

## 2015-11-22 NOTE — Care Management Important Message (Signed)
Important Message  Patient Details  Name: Corey Hughes MRN: [id number] Date of Birth: [birth year]   Medicare Important Message Given:  Yes    Juliann Pulse A Thedford Bunton 11/22/2015, 11:30 AM

## 2015-11-22 NOTE — Care Management (Signed)
Patient and family has elected to discharge home with hospice.  Anticipate discharge 12/7.  Will require ems transport.

## 2015-11-22 NOTE — Progress Notes (Signed)
Patient ID: Corey Hughes, male   DOB: [birth year], 74 y.o.   MRN: [id number] [facility] PROGRESS NOTE  PCP: Tama High III, MD  HPI/Subjective: Patient's breathing remained stable, had a modified barium today which shows no aspiration but some dysfunction of his oropharyngeal muscles    Objective: Filed Vitals:   11/22/15 0506 11/22/15 1108  BP: 128/72 120/61  Pulse: 71 79  Temp: 98.2 F (36.8 C) 98.4 F (36.9 C)  Resp: 20 20    Intake/Output Summary (Last 24 hours) at 11/22/15 1529 Last data filed at 11/22/15 1458  Gross per 24 hour  Intake    890 ml  Output    950 ml  Net    -60 ml   Filed Weights   11/17/15 0844  Weight: 55.792 kg (123 lb)    ROS: Review of Systems  Constitutional: No fever. Negative for chills.  Eyes: Negative for blurred vision.  Respiratory: Positive for cough and shortness of breath.   Cardiovascular: Negative for chest pain.  Gastrointestinal: Negative for nausea, vomiting, abdominal pain, diarrhea and constipation.  Genitourinary: Negative for dysuria.  Musculoskeletal: Negative for joint pain.  Neurological: Negative for dizziness and headaches.   Exam: Physical Exam  HENT:  Nose: No mucosal edema.  Mouth/Throat: No oropharyngeal exudate or posterior oropharyngeal edema.  Eyes: Conjunctivae and lids are normal. Pupils are equal, round, and reactive to light.  Did not look to the left for me.  Neck: No JVD present. Carotid bruit is not present. No edema present. No thyroid mass and no thyromegaly present.  Cardiovascular: S1 normal and S2 normal.  Exam reveals no gallop.   Murmur heard.  Systolic murmur is present with a grade of 2/6  Pulses:      Dorsalis pedis pulses are 2+ on the right side, and 2+ on the left side.  Respiratory: No respiratory distress. He has no wheezes. Diminished breath sounds GI: Soft. Bowel sounds are normal. There is no tenderness.  Musculoskeletal:       Right ankle: He exhibits  swelling.       Left ankle: He exhibits swelling.  Lymphadenopathy:    He has no cervical adenopathy.  Neurological: He is alert.  Patient did not want to look to the left. He is able to lift up both arms on his own. He tries to lift up his legs up off the bed but was not able to do it too well.  Skin: Skin is warm. Nails show no clubbing.  Bilateral feet and some reddish blackish discoloration on the toes bilaterally  Psychiatric: His affect is blunt. His speech is delayed.    Data Reviewed: Basic Metabolic Panel:  Recent Labs Lab 11/17/15 0852 11/18/15 0655 11/19/15 0448 11/21/15 0905 11/22/15 0840  NA 133* 131* 131* 136 134*  K 4.2 3.3* 3.1* 4.2 3.3*  CL 95* 100* 101 101 100*  CO2 28 24 23 27 26   GLUCOSE 111* 145* 118* 101* 121*  BUN 32* 26* 22* 31* 37*  CREATININE 1.04 0.95 1.01 1.01 1.10  CALCIUM 8.7* 7.5* 7.5* 7.8* 7.6*   Liver Function Tests:  Recent Labs Lab 11/17/15 0852  AST 34  ALT 21  ALKPHOS 135*  BILITOT 0.7  PROT 7.1  ALBUMIN 2.4*   CBC:  Recent Labs Lab 11/17/15 0852 11/18/15 0655 11/19/15 0448 11/20/15 0717 11/21/15 0905 11/22/15 0840  WBC 17.8* 16.0* 18.4* 23.6* 22.4* 21.3*  NEUTROABS 15.1*  --   --   --  20.0*  19.3*  HGB 14.3 11.0* 10.8* 10.8* 11.4* 11.4*  HCT 44.1 33.9* 32.5* 33.2* 35.2* 34.8*  MCV 102.7* 102.1* 101.4* 100.4* 100.5* 99.2  PLT 466* 399 421 449* 569* 659*   Cardiac Enzymes:  Recent Labs Lab 11/17/15 0852 11/17/15 1558 11/17/15 2147 11/18/15 0559  TROPONINI 0.19* 0.04* 0.03 0.05*     Recent Results (from the past 240 hour(s))  Blood culture (routine x 2)     Status: None   Collection Time: 11/17/15  8:37 AM  Result Value Ref Range Status   Specimen Description BLOOD  Final   Special Requests   Final    BOTTLES DRAWN AEROBIC AND ANAEROBIC  LEFT HAND AEROBIC 4 ML ANAEROBIC 4 ML   Culture NO GROWTH 5 DAYS  Final   Report Status 11/22/2015 FINAL  Final  Urine culture     Status: None   Collection Time:  11/17/15  8:37 AM  Result Value Ref Range Status   Specimen Description URINE, RANDOM  Final   Special Requests NONE  Final   Culture INSIGNIFICANT GROWTH  Final   Report Status 11/19/2015 FINAL  Final  Blood culture (routine x 2)     Status: None   Collection Time: 11/17/15  8:49 AM  Result Value Ref Range Status   Specimen Description BLOOD  Final   Special Requests   Final    BOTTLES DRAWN AEROBIC AND ANAEROBIC  LEFT AC AEROBIC 4ML ANAEROBIC 4 ML   Culture NO GROWTH 5 DAYS  Final   Report Status 11/22/2015 FINAL  Final  MRSA PCR Screening     Status: None   Collection Time: 11/17/15  1:05 PM  Result Value Ref Range Status   MRSA by PCR NEGATIVE NEGATIVE Final    Comment:        The GeneXpert MRSA Assay (FDA approved for NASAL specimens only), is one component of a comprehensive MRSA colonization surveillance program. It is not intended to diagnose MRSA infection nor to guide or monitor treatment for MRSA infections.   Culture, blood (routine x 2)     Status: None (Preliminary result)   Collection Time: 11/20/15  9:10 AM  Result Value Ref Range Status   Specimen Description BLOOD RIGHT AC  Final   Special Requests   Final    BOTTLES DRAWN AEROBIC AND ANAEROBIC  AER 5CC ANA 2CC   Culture NO GROWTH 2 DAYS  Final   Report Status PENDING  Incomplete  Culture, blood (routine x 2)     Status: None (Preliminary result)   Collection Time: 11/20/15  9:24 AM  Result Value Ref Range Status   Specimen Description BLOOD LEFT HAND  Final   Special Requests BOTTLES DRAWN AEROBIC AND ANAEROBIC  6CC  Final   Culture NO GROWTH 2 DAYS  Final   Report Status PENDING  Incomplete     Studies: No results found.  Scheduled Meds: . ampicillin-sulbactam (UNASYN) IV  3 g Intravenous Q6H  . aspirin  325 mg Oral Daily  . carbidopa-levodopa  1 tablet Oral TID  . diltiazem  30 mg Oral 4 times per day  . enoxaparin (LOVENOX) injection  40 mg Subcutaneous Q24H  . ipratropium-albuterol  3 mL  Nebulization TID  . metoprolol tartrate  25 mg Oral BID  . pantoprazole sodium  40 mg Per Tube Daily   Continuous Infusions:    Assessment/Plan:  1. Clinical sepsis. Due to aspiration pneumonia multifocal WBC count little lower, have discussed the case with the family  again now they're leaning more towards home and not interested in a PEG tube.   2. Atrial fibrillation with rapid ventricular response-continue oral Cardizem heart rate is stable 3. Dysphagia- patient was put on dysphagia diet by speech therapy. Overall prognosis is poor and palliative care consultation appreciated 4. Elevated troponin demand ischemia from sepsis and atrial fibrillation with rapid ventricular rate. 5. Parkinson's with dementia-  Sinemet. Appreciate palliative care consultation 6. Acute encephalopathy- likely related to aspiration pneumonia 7. Acute respiratory failure with hypoxia- from pneumonia. Continue oxygen supplementation  Code Status:     Code Status Orders        Start     Ordered   11/18/15 1452  Do not attempt resuscitation (DNR)   Continuous    Question Answer Comment  In the event of cardiac or respiratory ARREST Do not call a "code blue"   In the event of cardiac or respiratory ARREST Do not perform Intubation, CPR, defibrillation or ACLS   In the event of cardiac or respiratory ARREST Use medication by any route, position, wound care, and other measures to relive pain and suffering. May use oxygen, suction and manual treatment of airway obstruction as needed for comfort.   Comments DNR and DNI and NO FEEDING TUBES      11/18/15 1451     Family Communication: Wife and daughter at the bedside. Palliative care made a DO NOT RESUSCITATE Disposition Plan: Potentially home with hospice  Consultants:  Palliative care team  Antibiotics:  Vancomycin  Unasyn  Time spent: 30 minutes  [place], [facility]  [facility]

## 2015-11-22 NOTE — Evaluation (Signed)
Objective Swallowing Evaluation: MBS-Modified Barium Swallow Study  Patient Details  Name: Corey Hughes MRN: [id number] Date of Birth: 04-15-1941  Today's Date: 11/22/2015 Time: SLP Start Time (ACUTE ONLY): 0950-SLP Stop Time (ACUTE ONLY): 1050 SLP Time Calculation (min) (ACUTE ONLY): 60 min  Past Medical History:  Past Medical History  Diagnosis Date  . Chronic pain   . BPH (benign prostatic hyperplasia)   . Melanoma ([place])   . Tobacco abuse   . Pneumonia February 2013  . 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 Anemia   . Depression   . 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 Skin surgery      for melanoma   HPI: Pt is a 74 y.o. male history of chronic pain, old stroke, depresssion, melanoma, pneumonia, melanoma, Parkinson's Dis. w/ Dementia presenting with fever, nausea and vomiting. Patient has been vomiting and has poor appetite for the last 4 days. Fever 100.4 as per the wife yesterday. She offered him tylenol but he spit it up. He is weaker than usual and less responsive. He was admitted 2 months ago for sepsis of unknown origin. Concern for possible ileus. Pt responds to basic questions only; less verbal output. Pt has not been consistently tolerating his initial dysphagia diet w/ Nectar consistency liquids per MD, and the diet was downgraded to Honey consistency liquids; he appears to be tolerating this diet. Pt requires strict aspiration precautions.   Subjective: pt awake, verbally responsive to basic questions. Appeared weak.  Chief complaint: dysphagia   Objective:  Radiological Procedure: A videoflouroscopic evaluation of oral-preparatory, reflex initiation, and pharyngeal phases of the swallow was performed; as well as a screening of the upper esophageal phase.  I. POSTURE: upright II. VIEW: lateral III. COMPENSATORY STRATEGIES: Honey and Nectar liquids  via TSP; alternating food/liquids; double swallows; chin tuck w/ swallowing IV. BOLUSES ADMINISTERED:  Thin Liquid: NT  Nectar-thick Liquid: 3 trials via TSP  Honey-thick Liquid: 5 trials via TSP  Puree: 3 trials via TSP  Mechanical Soft: NT V. RESULTS OF EVALUATION: A. ORAL PREPARATORY PHASE: (The lips, tongue, and velum are observed for strength and coordination)       **Overall Severity Rating: Moderate. Pt exhibited decreased bolus control w/ nectar consistency liquids resulting in premature spillage into the pharynx. Min. Oral residue remained(diffuse).   B. SWALLOW INITIATION/REFLEX: (The reflex is normal if "triggered" by the time the bolus reached the base of the tongue)  **Overall Severity Rating: Severe. Pt exhibited a delayed pharyngeal swallow initiation w/ nectar and honey consistency liquids via tsp; nectar liquids filled the pyriform sinuses b/f swallow was initiated. Puree and honey consistency liquids appeared to trigger the swallow at/around the valleculae.   C. PHARYNGEAL PHASE: (Pharyngeal function is normal if the bolus shows rapid, smooth, and continuous transit through the pharynx and there is no pharyngeal residue after the swallow)  **Overall Severity Rating: Moderate+. Pt exhibited pharyngeal residue throughout w/ all consistencies assessed indicating reduced laryngeal excursion and decreased pharyngeal pressure during the swallowing. Pt was able to use a f/u, dry swallow to reduce this residue. Pt was also able to use a chin tuck (inconsistently) when swallowing which aided reduction of the pharyngeal residue.   D. LARYNGEAL PENETRATION: (Material entering into the laryngeal inlet/vestibule but not aspirated): none noted during this study E. ASPIRATION: none noted during this study F. ESOPHAGEAL PHASE: (Screening of the  upper esophagus): none noted during brief screening   ASSESSMENT: Pt appears to present w/ moderate-severe oropharyngeal phase dysphagia c/b decreased  oral control of thinner liquid consistency(nectar) and a severely, delayed pharyngeal swallow initiation w/ all trial consistencies. This along w/ the pharyngeal residue remaining post swallows increases risk for aspiration both during, and post, swallowing. Aspiration of po material can lead to pulmonary compromise. Pt appeared to tolerate TSP trials of Honey consistency liquids and purees following strict aspiration precautions and swallowing strategies including chin tuck and f/u, dry swallows to aid reducing pharyngeal residue. Pt required feeding.   PLAN/RECOMMENDATIONS:  A. Diet: Dysphagia 1 w/ Honey consistency liquids by TSP (to control bolus size/amount)  B. Swallowing Precautions: aspiration precautions; f/u, dry swallows; cues to chin tuck when swallowing; alternate food/liquid boluses  C. Recommended consultation to: dietician for nutritional support; Palliative Care consult  D. Therapy recommendations: f/u at SNF for dysphagia tx/education on aspiration precautions  E. Results and recommendations were discussed w/ MD; NSG   CHL IP CLINICAL IMPRESSIONS 11/22/2015  Therapy Diagnosis Moderate oral phase dysphagia;Severe pharyngeal phase dysphagia  Clinical Impression --  Impact on safety and function Moderate aspiration risk      CHL IP TREATMENT RECOMMENDATION 11/22/2015  Treatment Recommendations Therapy as outlined in treatment plan below     Prognosis 11/22/2015  Prognosis for Safe Diet Advancement Fair  Barriers to Reach Goals Cognitive deficits  Barriers/Prognosis Comment --    CHL IP DIET RECOMMENDATION 11/22/2015  SLP Diet Recommendations Dysphagia 1 (Puree) solids;Honey thick liquids  Liquid Administration via Spoon  Medication Administration Crushed with puree  Compensations Minimize environmental distractions;Slow rate;Small sips/bites;Multiple dry swallows after each bite/sip;Follow solids with liquid  Postural Changes Seated upright at 90 degrees      CHL IP OTHER  RECOMMENDATIONS 11/22/2015  Recommended Consults (No Data)  Oral Care Recommendations Oral care BID;Staff/trained caregiver to provide oral care  Other Recommendations Order thickener from pharmacy;Prohibited food (jello, ice cream, thin soups);Remove water pitcher      CHL IP FOLLOW UP RECOMMENDATIONS 11/22/2015  Follow up Recommendations Skilled Nursing facility      [facility] IP FREQUENCY AND DURATION 11/22/2015  Speech Therapy Frequency (ACUTE ONLY) min 2x/week  Treatment Duration 1 week               Orinda Kenner, MS, CCC-SLP  Watson,Katherine 11/22/2015, 11:08 AM

## 2015-11-22 NOTE — Progress Notes (Addendum)
New referral [facility] at home after discharge received from [facility]. Patient seen sitting up in bed alert, very slow to respond, does make eye contact. He is now bed bound and unable to participate in activities of daily living, he attempts to feed himself at times, does continue to cough with eating.  Taking oral medications crushed in applesauce. He has had a 20 lb weight loss since August 2016, albumin 2.4.  Corey Hughes was admitted to [facility] on 12/1 for treatment of a UTI and elevated troponin. He has been treated with IV antibiotics for suspected PNA and has required cardizem IV for a rapid heart rate.  Swallow evaluations done show a delayed swallow. High risk for aspiration.  Writer met with Mrs. Dorrough in the family room.  She has spoken to her family and the consensus is that hospice services would be beneficial and she confirmed that the patient would not want a feeding tube. She reports that he was very active at one time and they had already decided together that this is not what either of them wanted.We discussed aspiration precautions,  Education initiated regarding hospice services, philosophy and team approach to care with good understanding voiced by Mrs. Bille.  No DME needs at this time, patient has a hospital bed and oxygen in place in the home. Information faxed to referral. Patient will require EMS transportation home. CMRN Nann made aware. Thank you for the opportunity to participate in the care of this patient.  Flo Shanks RN, BSN, [place] [facility] [place], [facility] [phone]

## 2015-11-22 NOTE — Care Management (Signed)
Patient placed on dyshagia 1 diet.  Corey Hughes with [facility] will meet with patient and family today.

## 2015-11-22 NOTE — Progress Notes (Signed)
ANTIBIOTIC CONSULT NOTE - Follow up  Pharmacy Consult for Unasyn Indication: Sepsis/HCAP  Allergies  Allergen Reactions  . Morphine And Related Shortness Of Breath   Patient Measurements: Height: 5\' 8"  (172.7 cm) Weight: 123 lb (55.792 kg) IBW/kg (Calculated) : 68.4  Vital Signs: Temp: 98.4 F (36.9 C) (12/06 1108) Temp Source: Oral (12/06 1108) BP: 120/61 mmHg (12/06 1108) Pulse Rate: 79 (12/06 1108) Intake/Output from previous day: 12/05 0701 - 12/06 0700 In: 820 [P.O.:720; IV Piggyback:100] Out: 550 [Urine:550] Intake/Output from this shift: Total I/O In: 480 [P.O.:480] Out: -   Recent Labs  11/20/15 0717 11/21/15 0905 11/22/15 0840  WBC 23.6* 22.4* 21.3*  HGB 10.8* 11.4* 11.4*  PLT 449* 569* 659*  CREATININE  --  1.01 1.10   Estimated Creatinine Clearance: 45.8 mL/min (by C-G formula based on Cr of 1.1).    Microbiology: Recent Results (from the past 720 hour(s))  Blood culture (routine x 2)     Status: None   Collection Time: 11/17/15  8:37 AM  Result Value Ref Range Status   Specimen Description BLOOD  Final   Special Requests   Final    BOTTLES DRAWN AEROBIC AND ANAEROBIC  LEFT HAND AEROBIC 4 ML ANAEROBIC 4 ML   Culture NO GROWTH 5 DAYS  Final   Report Status 11/22/2015 FINAL  Final  Urine culture     Status: None   Collection Time: 11/17/15  8:37 AM  Result Value Ref Range Status   Specimen Description URINE, RANDOM  Final   Special Requests NONE  Final   Culture INSIGNIFICANT GROWTH  Final   Report Status 11/19/2015 FINAL  Final  Blood culture (routine x 2)     Status: None   Collection Time: 11/17/15  8:49 AM  Result Value Ref Range Status   Specimen Description BLOOD  Final   Special Requests   Final    BOTTLES DRAWN AEROBIC AND ANAEROBIC  LEFT AC AEROBIC 4ML ANAEROBIC 4 ML   Culture NO GROWTH 5 DAYS  Final   Report Status 11/22/2015 FINAL  Final  MRSA PCR Screening     Status: None   Collection Time: 11/17/15  1:05 PM  Result Value Ref  Range Status   MRSA by PCR NEGATIVE NEGATIVE Final    Comment:        The GeneXpert MRSA Assay (FDA approved for NASAL specimens only), is one component of a comprehensive MRSA colonization surveillance program. It is not intended to diagnose MRSA infection nor to guide or monitor treatment for MRSA infections.   Culture, blood (routine x 2)     Status: None (Preliminary result)   Collection Time: 11/20/15  9:10 AM  Result Value Ref Range Status   Specimen Description BLOOD RIGHT AC  Final   Special Requests   Final    BOTTLES DRAWN AEROBIC AND ANAEROBIC  AER 5CC ANA 2CC   Culture NO GROWTH 2 DAYS  Final   Report Status PENDING  Incomplete  Culture, blood (routine x 2)     Status: None (Preliminary result)   Collection Time: 11/20/15  9:24 AM  Result Value Ref Range Status   Specimen Description BLOOD LEFT HAND  Final   Special Requests BOTTLES DRAWN AEROBIC AND ANAEROBIC  6CC  Final   Culture NO GROWTH 2 DAYS  Final   Report Status PENDING  Incomplete    Medications:  Scheduled:  . ampicillin-sulbactam (UNASYN) IV  3 g Intravenous Q6H  . aspirin  325 mg Oral  Daily  . carbidopa-levodopa  1 tablet Oral TID  . diltiazem  30 mg Oral 4 times per day  . enoxaparin (LOVENOX) injection  40 mg Subcutaneous Q24H  . ipratropium-albuterol  3 mL Nebulization TID  . metoprolol tartrate  25 mg Oral BID  . pantoprazole sodium  40 mg Per Tube Daily   Anti-infectives    Start     Dose/Rate Route Frequency Ordered Stop   11/22/15 1600  Ampicillin-Sulbactam (UNASYN) 3 g in sodium chloride 0.9 % 100 mL IVPB     3 g 100 mL/hr over 60 Minutes Intravenous Every 6 hours 11/22/15 1139     11/20/15 1100  Ampicillin-Sulbactam (UNASYN) 3 g in sodium chloride 0.9 % 100 mL IVPB  Status:  Discontinued     3 g 100 mL/hr over 60 Minutes Intravenous Every 6 hours 11/20/15 1004 11/20/15 1006   11/20/15 1030  vancomycin (VANCOCIN) IVPB 750 mg/150 ml premix  Status:  Discontinued     750 mg 150 mL/hr  over 60 Minutes Intravenous Every 12 hours 11/20/15 0948 11/20/15 1323   11/20/15 1030  ampicillin-sulbactam (UNASYN) 1.5 g in sodium chloride 0.9 % 50 mL IVPB  Status:  Discontinued     1.5 g 100 mL/hr over 30 Minutes Intravenous Every 6 hours 11/20/15 0957 11/22/15 1139   11/18/15 2000  vancomycin (VANCOCIN) IVPB 750 mg/150 ml premix  Status:  Discontinued     750 mg 150 mL/hr over 60 Minutes Intravenous Every 18 hours 11/17/15 1157 11/20/15 0948   11/18/15 0100  vancomycin (VANCOCIN) IVPB 750 mg/150 ml premix     750 mg 150 mL/hr over 60 Minutes Intravenous  Once 11/17/15 1157 11/18/15 0300   11/17/15 1800  piperacillin-tazobactam (ZOSYN) IVPB 3.375 g  Status:  Discontinued     3.375 g 12.5 mL/hr over 240 Minutes Intravenous Every 8 hours 11/17/15 1157 11/20/15 0837   11/17/15 1030  piperacillin-tazobactam (ZOSYN) IVPB 3.375 g     3.375 g 100 mL/hr over 30 Minutes Intravenous  Once 11/17/15 1026 11/17/15 1059   11/17/15 1030  vancomycin (VANCOCIN) IVPB 1000 mg/200 mL premix     1,000 mg 200 mL/hr over 60 Minutes Intravenous  Once 11/17/15 1026 11/17/15 1150     Assessment: 74 yo Male with PMH of Parkinsons, Bilateral PNA, and COPD.  Patient with hospitalization 2 months ago for Sepsis.  Goal of Therapy:  Vancomycin trough level 15-20 mcg/ml  Plan:  Zosyn d/c, abx changed to vancomycin and Unasyn. Will begin Unasyn 1.5 g iv q 6 hours. Vancomycin trough is below goal at 9 so will increase vancomycin dose to 750 mg iv q 12 hours and check another trough with the 5th dose of this new regimen. Pharmacy will continue to monitor labs and renal function and make adjustments as needed.  12/6: Vancomycin d/c on 12/4. Dysphagia I, for possible PEG tube? WBC slow to improve. Will change Unasyn to 3 gram IV q6h for Bilateral aspiration PNA. Continue to monitor renal fxn.  Chinita Greenland PharmD Clinical Pharmacist 11/22/2015 11:42 AM

## 2015-11-23 LAB — CBC
HEMATOCRIT: 32.8 % — AB (ref 40.0–52.0)
HEMOGLOBIN: 11 g/dL — AB (ref 13.0–18.0)
MCH: 33.4 pg (ref 26.0–34.0)
MCHC: 33.4 g/dL (ref 32.0–36.0)
MCV: 100.2 fL — AB (ref 80.0–100.0)
Platelets: 659 10*3/uL — ABNORMAL HIGH (ref 150–440)
RBC: 3.28 MIL/uL — ABNORMAL LOW (ref 4.40–5.90)
RDW: 18.1 % — ABNORMAL HIGH (ref 11.5–14.5)
WBC: 20.6 10*3/uL — AB (ref 3.8–10.6)

## 2015-11-23 MED ORDER — ESOMEPRAZOLE MAGNESIUM 40 MG PO CPDR: 40.0000 mg | DELAYED_RELEASE_CAPSULE | Freq: Every day | ORAL | Status: AC

## 2015-11-23 MED ORDER — GUAIFENESIN ER 600 MG PO TB12: 600.0000 mg | ORAL_TABLET | Freq: Two times a day (BID) | ORAL | Status: AC

## 2015-11-23 MED ORDER — AMOXICILLIN-POT CLAVULANATE 875-125 MG PO TABS: 1.0000 | ORAL_TABLET | Freq: Two times a day (BID) | ORAL | Status: AC

## 2015-11-23 MED ORDER — METOPROLOL TARTRATE 50 MG PO TABS: 50.0000 mg | ORAL_TABLET | Freq: Two times a day (BID) | ORAL | Status: AC

## 2015-11-23 NOTE — Discharge Summary (Signed)
Corey Hughes, 74 y.o., DOB February 20, 1941, MRN [id number]. Admission date: 11/17/2015 Discharge Date 11/23/2015 Primary MD BERT Odetta Pink, MD Admitting Physician Theodoro Grist, MD  Admission Diagnosis  Elevated troponin [R79.89] Atrial fibrillation with RVR ([place]) [I48.91] HCAP (healthcare-associated pneumonia) [J18.9]  Discharge Diagnosis   Active Problems:   Sepsis ([place]) due to multifocal pneumonia as a result of aspiration pneumonia   Elevated troponin due to demand ischemia   Aspiration pneumonia   Dysphagia   Nausea & vomiting   Ileus (HCC)   Malnutrition of moderate degree   Multifocal atrial tachycardia (HCC) Atrial fibrillation with rapid ventricular rate Acute encephalopathy due to pneumonia Acute respiratory failure with hypoxia from aspiration pneumonia      Corey Hughes is a 74 y.o. male with a known history of recent admission for pneumonia 2, history of stroke with right-sided weakness, COPD, Parkinson's dementia who presents to the hospital with nausea, vomiting, choking, as well as strangling on food. Patient has been having problems with choking strangling on food regurgitating have having nausea and vomiting for the past week. On arrival to emergency room, he was noted to be in atrial fibrillation, RVR, rate of 150. His labs revealed hyponatremia, hypoalbuminemia, elevated troponin to 0.19. Elevated white blood cell count is 17.8 and elevated lactic acid level to 2.4. Radiologic studies revealed a right hilar opacity concerning for pneumonia and mild ileus. Patient was admitted with sepsis due to aspiration pneumonia and started on broad-spectrum antibiotics. He underwent a CT of the chest which showed multifocal pneumonia consistent with recurrent aspiration. He had a swallow evaluation and his diet has been modified. The MBBS showed no overt aspiration with the current diet but he has delayed swallowing. Likely causing him to aspirate with any type  of stress. I have discussed this with the wife and the family. He was also seen by palliative care team they're not interested in the PEG tube placement. He is currently having home hospice come to his house. Patient's atrial fibrillation required IV Cardizem heart rate is now controlled.          Consults  cardiology  Significant Tests:  See full reports for all details      Abd 1 View (kub)  11/18/2015  CLINICAL DATA:  Ileus. EXAM: ABDOMEN - 1 VIEW COMPARISON:  11/17/2015. FINDINGS: Soft tissue structures are unremarkable. Interim partial resolution of air-filled small bowel. Colonic gas pattern is stable. No free air. Degenerative changes lumbar spine. Pelvic calcifications consistent phleboliths. Atherosclerotic vascular calcifications. IMPRESSION: 1. Interim partial resolution of air-filled small bowel. Colonic gas pattern is normal. No free air. Findings consistent with improving adynamic ileus. 2. Aortoiliac atherosclerotic vascular disease . Electronically Signed   By: Marcello Moores  Register   On: 11/18/2015 07:15   Ct Chest W Contrast  11/20/2015  CLINICAL DATA:  Patient with pneumonia and leukocytosis. History of melanoma. EXAM: CT CHEST WITH CONTRAST CT ABDOMEN AND PELVIS WITH CONTRAST TECHNIQUE: Multidetector CT imaging of the chest was performed during intravenous contrast administration. Multidetector CT imaging of the abdomen and pelvis was performed following the standard protocol after bolus administration of intravenous contrast. CONTRAST:  17mL OMNIPAQUE IOHEXOL 300 MG/ML  SOLN COMPARISON:  Chest radiograph 11/17/2015 FINDINGS: CT CHEST FINDINGS Mediastinum/Lymph Nodes: There are modestly enlarged mediastinal lymph nodes bilaterally, likely reactive. The heart is normal in size. There is atherosclerotic disease of the coronary arteries and aorta. Mitral valve annular calcifications are seen. Lungs/Pleura: There are bilateral pleural effusions with compressive dependent  atelectasis in  the lung bases. There is dense airspace consolidation of the right upper lobe and less so left upper lobe. Smaller patchy areas of airspace consolidation are also seen within the right middle lobe, right lower lobe, lingula and left lower lobe. There is background of emphysematous and chronic interstitial lung changes. Musculoskeletal: No chest wall mass or suspicious bone lesions identified. CT ABDOMEN PELVIS FINDINGS Hepatobiliary: There are 2 less than 4 mm hypoattenuated lesions within the left lobe of the liver, too small to be accurately characterize by CT. There is a sliver of perihepatic and pericholecystic free fluid. Pancreas: No mass, inflammatory changes, or other significant abnormality. Spleen: Within normal limits in size and appearance. Adrenals/Urinary Tract: No masses identified. No evidence of hydronephrosis. There is a 12 mm left renal cyst. There is nonspecific perinephric fat stranding. Stomach/Bowel: No evidence of obstruction, inflammatory process, or abnormal fluid collections. There is left colonic diverticulosis without evidence of diverticulitis. Vascular/Lymphatic: No pathologically enlarged lymph nodes. No evidence of abdominal aortic aneurysm. Atherosclerotic disease of the aorta and common iliac arteries is seen. Reproductive: No mass or other significant abnormality. The urinary bladder is decompressed around urinary Foley. Other: None. Musculoskeletal: No suspicious bone lesions identified. There are multilevel osteoarthritic changes of the lumbosacral spine. IMPRESSION: Multifocal bilateral pneumonia, worse in the left upper lobe. Bilateral pleural effusions. Obstructing endobronchial lesion cannot be excluded, although one was not seen. Follow-up to resolution is recommended. Probable reactive mediastinal lymphadenopathy. Small amount of free fluid in the abdomen, mostly in perihepatic location. Left colonic diverticulosis without evidence of diverticulitis. Atherosclerotic  disease of the aorta. Electronically Signed   By: Fidela Salisbury M.D.   On: 11/20/2015 14:28   Ct Abdomen Pelvis W Contrast  11/20/2015  CLINICAL DATA:  Patient with pneumonia and leukocytosis. History of melanoma. EXAM: CT CHEST WITH CONTRAST CT ABDOMEN AND PELVIS WITH CONTRAST TECHNIQUE: Multidetector CT imaging of the chest was performed during intravenous contrast administration. Multidetector CT imaging of the abdomen and pelvis was performed following the standard protocol after bolus administration of intravenous contrast. CONTRAST:  148mL OMNIPAQUE IOHEXOL 300 MG/ML  SOLN COMPARISON:  Chest radiograph 11/17/2015 FINDINGS: CT CHEST FINDINGS Mediastinum/Lymph Nodes: There are modestly enlarged mediastinal lymph nodes bilaterally, likely reactive. The heart is normal in size. There is atherosclerotic disease of the coronary arteries and aorta. Mitral valve annular calcifications are seen. Lungs/Pleura: There are bilateral pleural effusions with compressive dependent atelectasis in the lung bases. There is dense airspace consolidation of the right upper lobe and less so left upper lobe. Smaller patchy areas of airspace consolidation are also seen within the right middle lobe, right lower lobe, lingula and left lower lobe. There is background of emphysematous and chronic interstitial lung changes. Musculoskeletal: No chest wall mass or suspicious bone lesions identified. CT ABDOMEN PELVIS FINDINGS Hepatobiliary: There are 2 less than 4 mm hypoattenuated lesions within the left lobe of the liver, too small to be accurately characterize by CT. There is a sliver of perihepatic and pericholecystic free fluid. Pancreas: No mass, inflammatory changes, or other significant abnormality. Spleen: Within normal limits in size and appearance. Adrenals/Urinary Tract: No masses identified. No evidence of hydronephrosis. There is a 12 mm left renal cyst. There is nonspecific perinephric fat stranding. Stomach/Bowel: No  evidence of obstruction, inflammatory process, or abnormal fluid collections. There is left colonic diverticulosis without evidence of diverticulitis. Vascular/Lymphatic: No pathologically enlarged lymph nodes. No evidence of abdominal aortic aneurysm. Atherosclerotic disease of the aorta and common iliac arteries  is seen. Reproductive: No mass or other significant abnormality. The urinary bladder is decompressed around urinary Foley. Other: None. Musculoskeletal: No suspicious bone lesions identified. There are multilevel osteoarthritic changes of the lumbosacral spine. IMPRESSION: Multifocal bilateral pneumonia, worse in the left upper lobe. Bilateral pleural effusions. Obstructing endobronchial lesion cannot be excluded, although one was not seen. Follow-up to resolution is recommended. Probable reactive mediastinal lymphadenopathy. Small amount of free fluid in the abdomen, mostly in perihepatic location. Left colonic diverticulosis without evidence of diverticulitis. Atherosclerotic disease of the aorta. Electronically Signed   By: Fidela Salisbury M.D.   On: 11/20/2015 14:28   Dg Abd Acute W/chest  11/17/2015  CLINICAL DATA:  Abdominal pain.  Foul-smelling urine. EXAM: DG ABDOMEN ACUTE W/ 1V CHEST COMPARISON:  Chest x-ray 10/14/2015 FINDINGS: The upright chest x-ray demonstrates a right hilar opacity. This was not present on the prior chest film and is on likely a mass. It is most likely a perihilar pneumonia. No pleural effusion. The cardiac silhouette, mediastinal and hilar contours are unremarkable. Prominent skin fold over the left chest but no definite pneumothorax. A left lower lobe calcified granuloma is noted. Two views of the abdomen demonstrate moderate air scattered throughout the small bowel and colon which could suggest a mild diffuse ileus or gastroenteritis. No findings for small bowel obstruction or free air. The soft tissue shadows are maintained. Vascular calcifications are noted. The  bony structures are intact. IMPRESSION: 1. Right hilar opacity, most likely pneumonia. Recommend post treatment follow-up chest x-ray in 3-4 weeks to make sure this resolves. 2. Possible mild ileus or gastroenteritis. No findings for obstruction or perforation. Electronically Signed   By: Marijo Sanes M.D.   On: 11/17/2015 09:34       Today   Subjective:   Corey Hughes  denies any complaints  Objective:   Blood pressure 121/54, pulse 77, temperature 98.6 F (37 C), temperature source Oral, resp. rate 20, height 5\' 8"  (1.727 m), weight 55.792 kg (123 lb), SpO2 92 %.  .  Intake/Output Summary (Last 24 hours) at 11/23/15 1535 Last data filed at 11/23/15 0614  Gross per 24 hour  Intake    300 ml  Output   1000 ml  Net   -700 ml    Exam VITAL SIGNS: Blood pressure 121/54, pulse 77, temperature 98.6 F (37 C), temperature source Oral, resp. rate 20, height 5\' 8"  (1.727 m), weight 55.792 kg (123 lb), SpO2 92 %.  GENERAL:  74 y.o.-year-old patient lying in the bed with no acute distress.  EYES: Pupils equal, round, reactive to light and accommodation. No scleral icterus. Extraocular muscles intact.  HEENT: Head atraumatic, normocephalic. Oropharynx and nasopharynx clear.  NECK:  Supple, no jugular venous distention. No thyroid enlargement, no tenderness.  LUNGS: Normal breath sounds bilaterally, no wheezing, rales,rhonchi or crepitation. No use of accessory muscles of respiration.  CARDIOVASCULAR: S1, S2 normal. No murmurs, rubs, or gallops.  ABDOMEN: Soft, nontender, nondistended. Bowel sounds present. No organomegaly or mass.  EXTREMITIES: No pedal edema, cyanosis, or clubbing.  NEUROLOGIC: Cranial nerves II through XII are intact. Muscle strength 5/5 in all extremities. Sensation intact. Gait not checked.  PSYCHIATRIC: The patient is alert and oriented x 3.  SKIN: No obvious rash, lesion, or ulcer.   Data Review     CBC w Diff: Lab Results  Component Value Date   WBC 20.6*  11/23/2015   WBC 6.8 08/02/2014   HGB 11.0* 11/23/2015   HGB 12.9* 08/02/2014   HCT  32.8* 11/23/2015   HCT 38.7* 08/02/2014   PLT 659* 11/23/2015   PLT 279 08/02/2014   LYMPHOPCT 3 11/22/2015   LYMPHOPCT 23.3 08/02/2014   BANDSPCT 0 02/01/2012   MONOPCT 6 11/22/2015   MONOPCT 9.3 08/02/2014   EOSPCT 0 11/22/2015   EOSPCT 0.1 08/02/2014   BASOPCT 0 11/22/2015   BASOPCT 0.3 08/02/2014   CMP: Lab Results  Component Value Date   NA 134* 11/22/2015   NA 136 08/02/2014   K 3.3* 11/22/2015   K 4.1 08/02/2014   CL 100* 11/22/2015   CL 102 08/02/2014   CO2 26 11/22/2015   CO2 30 08/02/2014   BUN 37* 11/22/2015   BUN 24* 08/02/2014   CREATININE 1.10 11/22/2015   CREATININE 1.09 08/02/2014   PROT 7.1 11/17/2015   PROT 8.1 07/29/2014   ALBUMIN 2.4* 11/17/2015   ALBUMIN 4.1 07/29/2014   BILITOT 0.7 11/17/2015   BILITOT 0.7 07/29/2014   ALKPHOS 135* 11/17/2015   ALKPHOS 97 07/29/2014   AST 34 11/17/2015   AST 25 07/29/2014   ALT 21 11/17/2015   ALT 34 07/29/2014  .  Micro Results Recent Results (from the past 240 hour(s))  Blood culture (routine x 2)     Status: None   Collection Time: 11/17/15  8:37 AM  Result Value Ref Range Status   Specimen Description BLOOD  Final   Special Requests   Final    BOTTLES DRAWN AEROBIC AND ANAEROBIC  LEFT HAND AEROBIC 4 ML ANAEROBIC 4 ML   Culture NO GROWTH 5 DAYS  Final   Report Status 11/22/2015 FINAL  Final  Urine culture     Status: None   Collection Time: 11/17/15  8:37 AM  Result Value Ref Range Status   Specimen Description URINE, RANDOM  Final   Special Requests NONE  Final   Culture INSIGNIFICANT GROWTH  Final   Report Status 11/19/2015 FINAL  Final  Blood culture (routine x 2)     Status: None   Collection Time: 11/17/15  8:49 AM  Result Value Ref Range Status   Specimen Description BLOOD  Final   Special Requests   Final    BOTTLES DRAWN AEROBIC AND ANAEROBIC  LEFT AC AEROBIC 4ML ANAEROBIC 4 ML   Culture NO GROWTH 5  DAYS  Final   Report Status 11/22/2015 FINAL  Final  MRSA PCR Screening     Status: None   Collection Time: 11/17/15  1:05 PM  Result Value Ref Range Status   MRSA by PCR NEGATIVE NEGATIVE Final    Comment:        The GeneXpert MRSA Assay (FDA approved for NASAL specimens only), is one component of a comprehensive MRSA colonization surveillance program. It is not intended to diagnose MRSA infection nor to guide or monitor treatment for MRSA infections.   Culture, blood (routine x 2)     Status: None (Preliminary result)   Collection Time: 11/20/15  9:10 AM  Result Value Ref Range Status   Specimen Description BLOOD RIGHT AC  Final   Special Requests   Final    BOTTLES DRAWN AEROBIC AND ANAEROBIC  AER 5CC ANA 2CC   Culture NO GROWTH 3 DAYS  Final   Report Status PENDING  Incomplete  Culture, blood (routine x 2)     Status: None (Preliminary result)   Collection Time: 11/20/15  9:24 AM  Result Value Ref Range Status   Specimen Description BLOOD LEFT HAND  Final   Special Requests BOTTLES  DRAWN AEROBIC AND ANAEROBIC  6CC  Final   Culture NO GROWTH 3 DAYS  Final   Report Status PENDING  Incomplete        Code Status Orders        Start     Ordered   11/18/15 1452  Do not attempt resuscitation (DNR)   Continuous    Question Answer Comment  In the event of cardiac or respiratory ARREST Do not call a "code blue"   In the event of cardiac or respiratory ARREST Do not perform Intubation, CPR, defibrillation or ACLS   In the event of cardiac or respiratory ARREST Use medication by any route, position, wound care, and other measures to relive pain and suffering. May use oxygen, suction and manual treatment of airway obstruction as needed for comfort.   Comments DNR and DNI and NO FEEDING TUBES      11/18/15 1451          Follow-up Information    Follow up with BERT Briscoe Burns III, MD. Go on 11/30/2015.   Specialty:  Internal Medicine   Why:  at 3:30pm.    Contact  information:   [address] [address] [phone]       Discharge Medications     Medication List    TAKE these medications        amoxicillin-clavulanate 875-125 MG tablet  Commonly known as:  AUGMENTIN  Take 1 tablet by mouth 2 (two) times daily.     aspirin EC 81 MG tablet  Take 81 mg by mouth daily.     buPROPion 150 MG 12 hr tablet  Commonly known as:  WELLBUTRIN SR  Take 150 mg by mouth 2 (two) times daily.     carbidopa-levodopa 25-100 MG tablet  Commonly known as:  SINEMET IR  Take 1 tablet by mouth 3 (three) times daily.     donepezil 5 MG tablet  Commonly known as:  ARICEPT  Take 5 mg by mouth at bedtime.     esomeprazole 40 MG capsule  Commonly known as:  NEXIUM  Take 1 capsule (40 mg total) by mouth daily.     gabapentin 300 MG capsule  Commonly known as:  NEURONTIN  Take 300 mg by mouth 5 (five) times daily.     guaiFENesin 600 MG 12 hr tablet  Commonly known as:  MUCINEX  Take 1 tablet (600 mg total) by mouth 2 (two) times daily.     hydrOXYzine 10 MG tablet  Commonly known as:  ATARAX/VISTARIL  Take 10 mg by mouth 3 (three) times daily as needed. For itching.     ipratropium-albuterol 0.5-2.5 (3) MG/3ML Soln  Commonly known as:  DUONEB  Take 3 mLs by nebulization every 6 (six) hours as needed. For asthma.     metoprolol 50 MG tablet  Commonly known as:  LOPRESSOR  Take 1 tablet (50 mg total) by mouth 2 (two) times daily.     mirtazapine 15 MG tablet  Commonly known as:  REMERON  Take 15 mg by mouth at bedtime.     RA VITAMIN B-12 TR 1000 MCG Tbcr  Generic drug:  Cyanocobalamin  Take 1,000 mcg by mouth daily.     tamsulosin 0.4 MG Caps capsule  Commonly known as:  FLOMAX  Take 0.4 mg by mouth 2 (two) times daily.           Total Time in preparing paper work, data evaluation and todays  exam - 35 minutes  Dustin Flock M.D on 11/23/2015 at 3:35 PM  [facility]    Office  [phone]

## 2015-11-23 NOTE — Progress Notes (Signed)
Follow up visit on new referral for [facility] services at home after discharge. Patient seen, sitting up in bed, alert, some nonproductive cough noted. Mrs. Smolinsky at bedside. Plan is for patient to discharge home today via EMS with signed portable DNR in place. Mrs. Vallas to return home and request that staff RN Malachy Mood contact her when EMS arrives. Thank you. Flo Shanks RN, BSN, [place] [facility] [place], [facility] [phone] c

## 2015-11-23 NOTE — Discharge Instructions (Signed)
°  DIET:  Dysphagia 1 diet with honey thick liquids  DISCHARGE CONDITION:  Fair  ACTIVITY:  Activity as tolerated  OXYGEN:  Home Oxygen: Yes.     Oxygen Delivery: 3 liters/min via Patient connected to nasal cannula oxygen  DISCHARGE LOCATION:  home    ADDITIONAL DISCHARGE INSTRUCTION:home hospice to follow Keep foley in   If you experience worsening of your admission symptoms, develop shortness of breath, life threatening emergency, suicidal or homicidal thoughts you must seek medical attention immediately by calling 911 or calling your MD immediately  if symptoms less severe.  You Must read complete instructions/literature along with all the possible adverse reactions/side effects for all the Medicines you take and that have been prescribed to you. Take any new Medicines after you have completely understood and accpet all the possible adverse reactions/side effects.   Please note  You were cared for by a hospitalist during your hospital stay. If you have any questions about your discharge medications or the care you received while you were in the hospital after you are discharged, you can call the unit and asked to speak with the hospitalist on call if the hospitalist that took care of you is not available. Once you are discharged, your primary care physician will handle any further medical issues. Please note that NO REFILLS for any discharge medications will be authorized once you are discharged, as it is imperative that you return to your primary care physician (or establish a relationship with a primary care physician if you do not have one) for your aftercare needs so that they can reassess your need for medications and monitor your lab values.

## 2015-11-23 NOTE — Care Management (Signed)
Patient will discharge home with hospice through [facility] today.  To travel by ems. auth received

## 2015-11-23 NOTE — Progress Notes (Signed)
Pt discharged to home with [facility], discontinue Cardizem per Dr Posey Pronto, IV site [facility], no bleeding,  pt changed into a cotton gown, tele monitor turned in, deep pressure injury noted on right heel, pt's wife educated on floating heels and emptying his urinary catheter, EMS will transport pt home.

## 2015-11-23 NOTE — Progress Notes (Signed)
Spoke with [place], [facility] rep at [phone], to notify of non-emergent EMS transport.  Auth notification reference given as [id number].   Service date range good from 11/23/15 - 02/21/16.   Gap exception requested to determine if services can be considered at an in-network level.

## 2015-11-25 LAB — CULTURE, BLOOD (ROUTINE X 2)
CULTURE: NO GROWTH
Culture: NO GROWTH

## 2015-12-18 DEATH — deceased

## 2016-04-30 IMAGING — CR DG ABDOMEN ACUTE W/ 1V CHEST
1 series · 3 of 3 positions shown · non-contrast
Comparison: Chest x-ray 10/14/2015

CLINICAL DATA: Abdominal pain.  Foul-smelling urine.

EXAM:
DG ABDOMEN ACUTE W/ 1V CHEST

[Series 1: dg abd acute w/chest · 0.14mm/px · 3 of 3 slices shown]
[im 1/3]
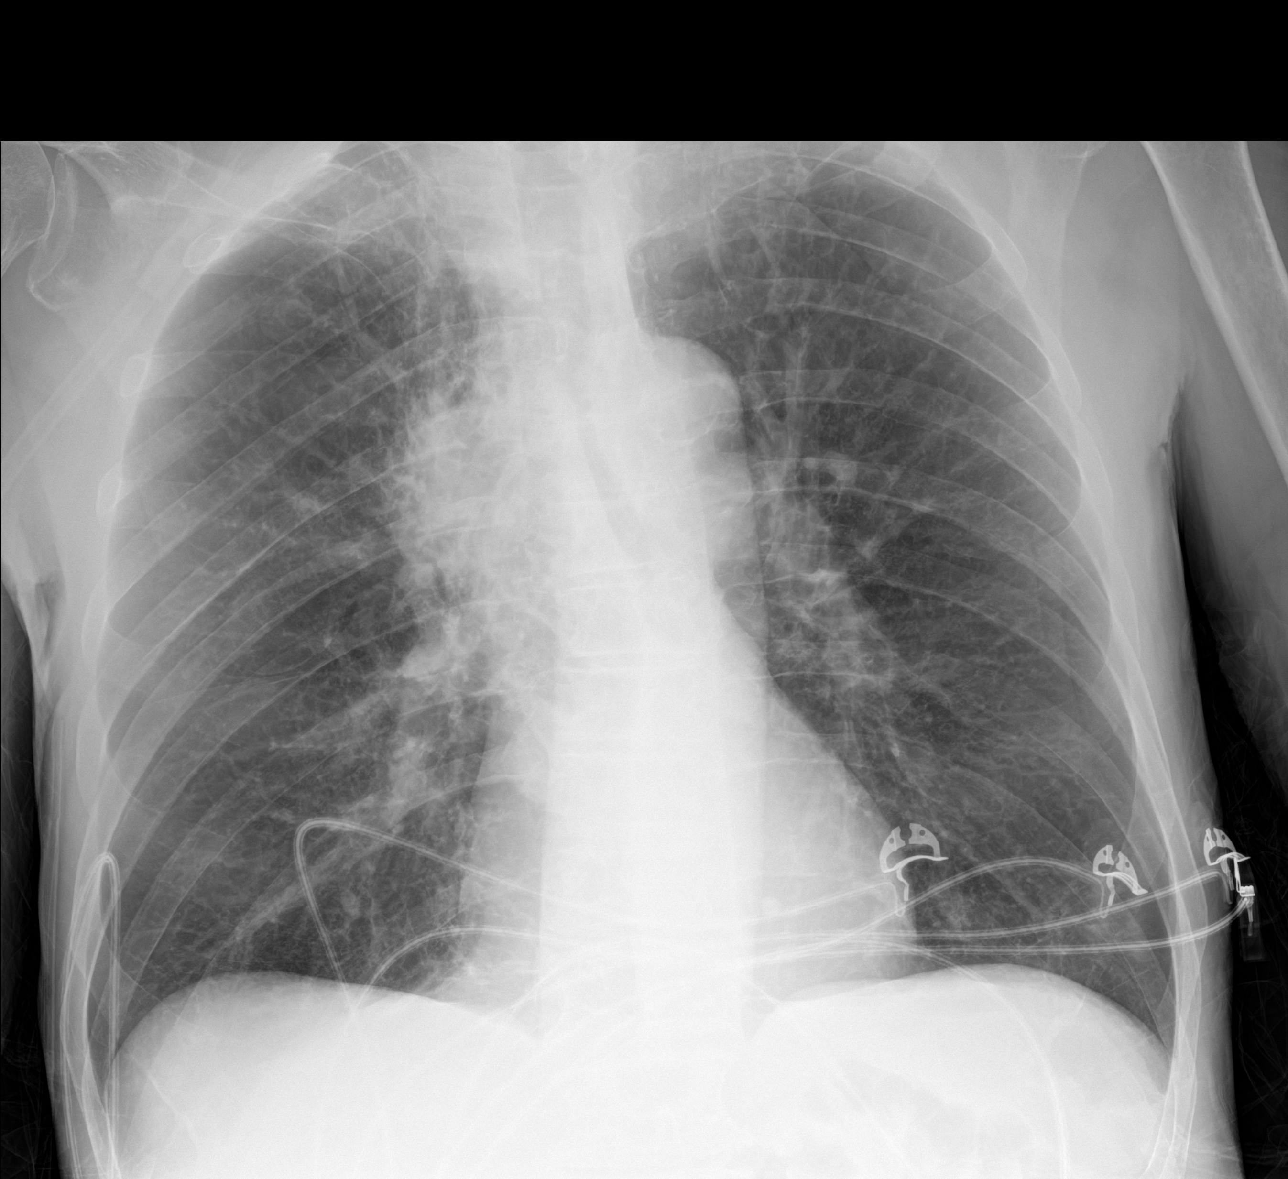
[im 2/3]
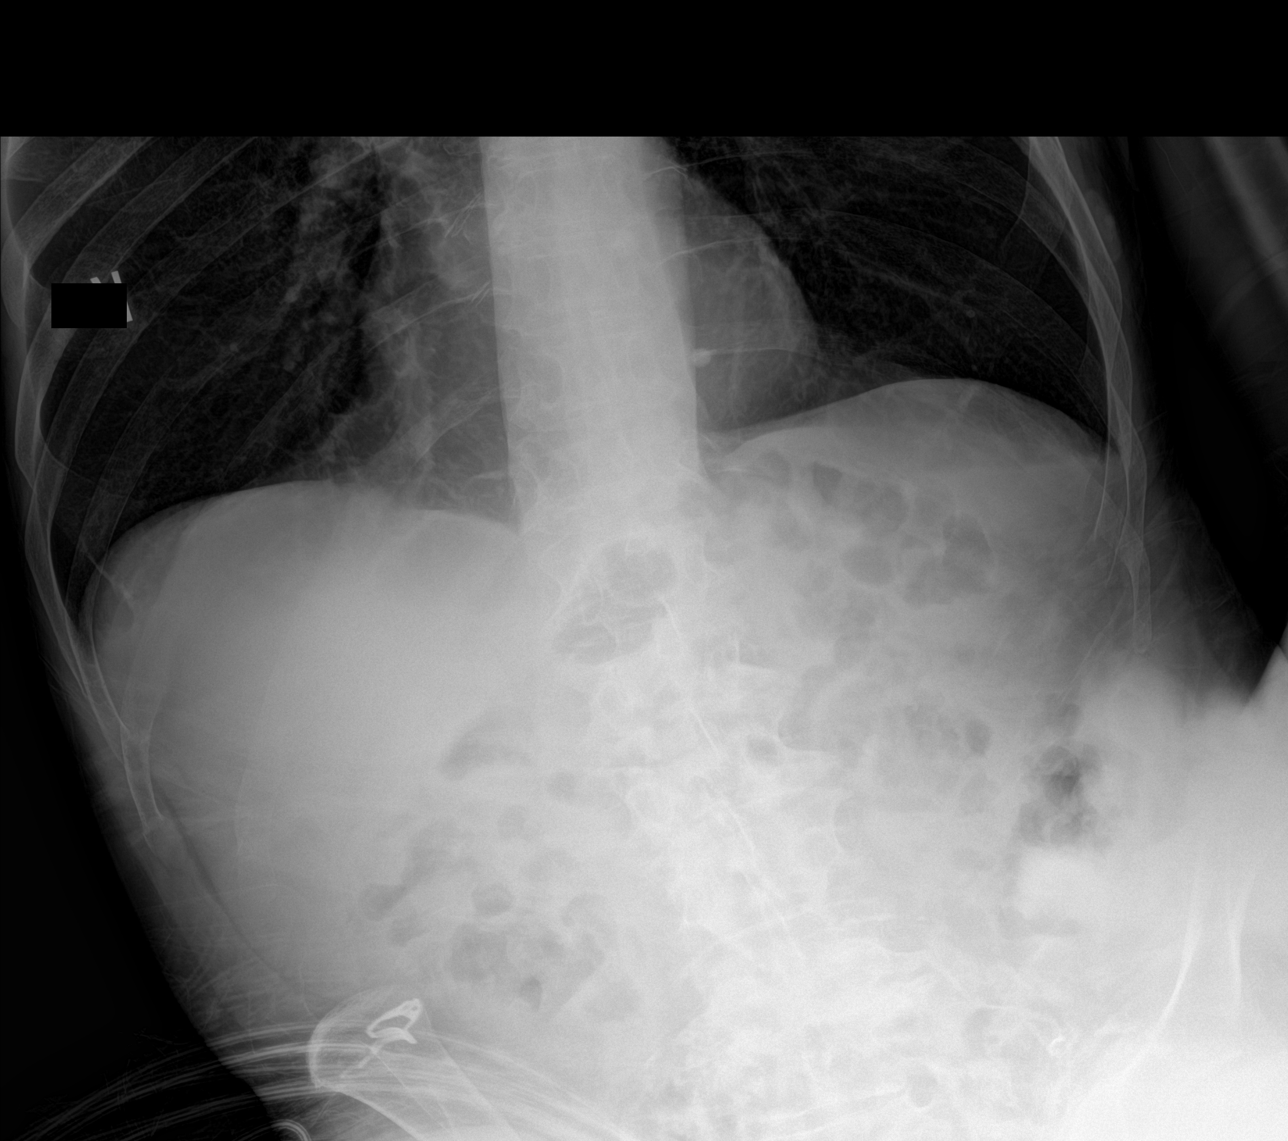
[im 3/3]
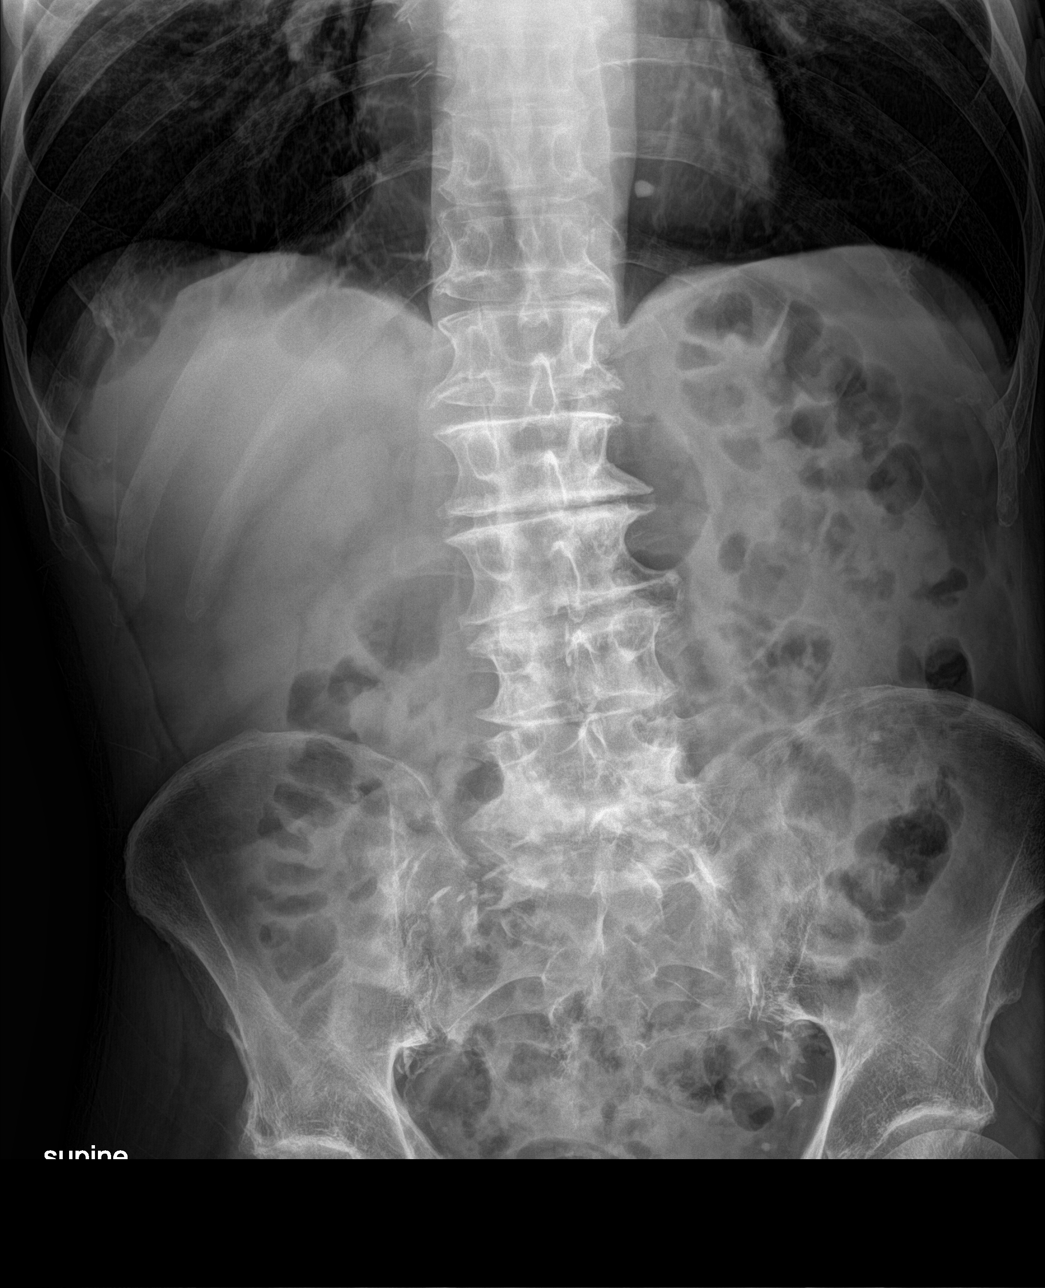

[3 of 3 positions shown; findings below may reference images not displayed]

FINDINGS: The upright chest x-ray demonstrates a right hilar opacity. This was
not present on the prior chest film and is on likely a mass. It is
most likely a perihilar pneumonia. No pleural effusion. The cardiac
silhouette, mediastinal and hilar contours are unremarkable.
Prominent skin fold over the left chest but no definite
pneumothorax. A left lower lobe calcified granuloma is noted.

Two views of the abdomen demonstrate moderate air scattered
throughout the small bowel and colon which could suggest a mild
diffuse ileus or gastroenteritis. No findings for small bowel
obstruction or free air. The soft tissue shadows are maintained.
Vascular calcifications are noted. The bony structures are intact.
IMPRESSION: 1. Right hilar opacity, most likely pneumonia. Recommend post
treatment follow-up chest x-ray in 3-4 weeks to make sure this
resolves.
2. Possible mild ileus or gastroenteritis. No findings for
obstruction or perforation.

## 2016-05-05 IMAGING — RF DG SWALLOWING FUNCTION - NRPT [facility]
13 of 15 series · 13 of 24 positions shown · non-contrast
Comparison: none

[Series 1: run · 1 of 31 frames shown (1 of 13)]
[frame 5/31]
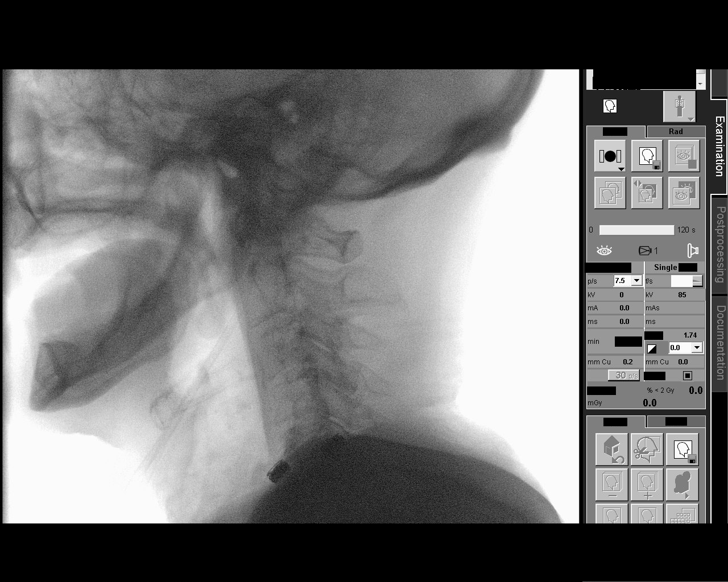

[Series 2: run · 1 of 188 frames shown (2 of 13)]
[frame 68/188]
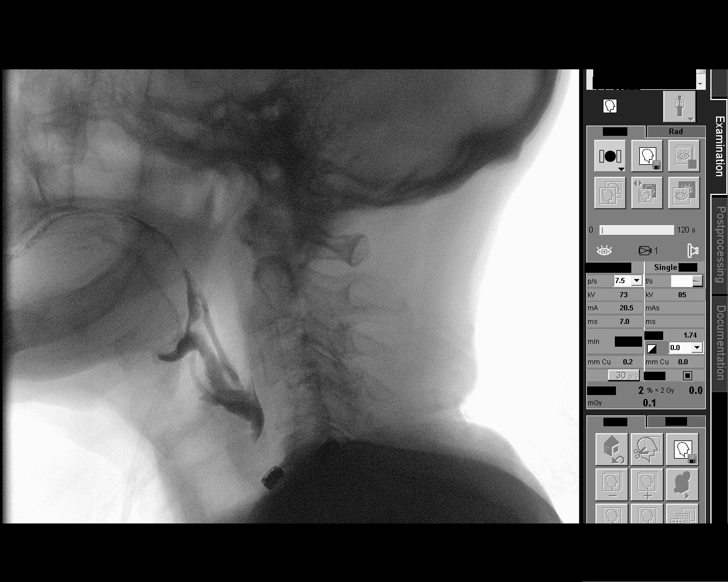

[Series 3: run · 1 of 63 frames shown (3 of 13)]
[frame 54/63]
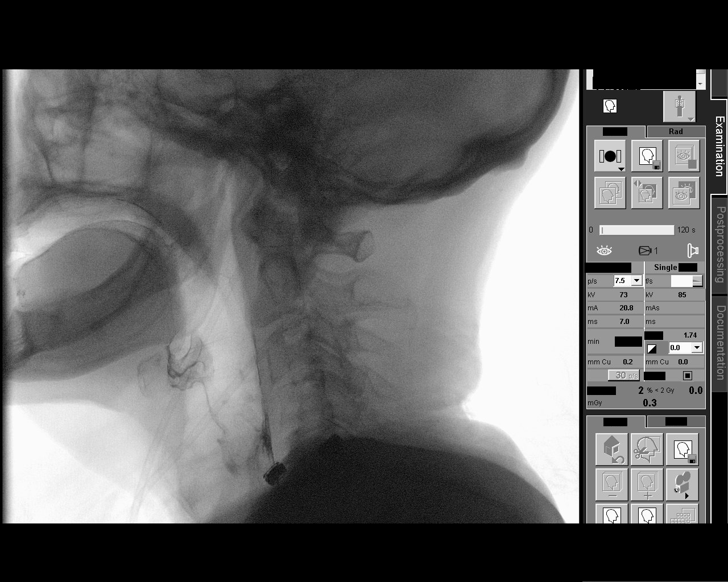

[Series 4: run · 1 of 233 frames shown (4 of 13)]
[frame 199/233]
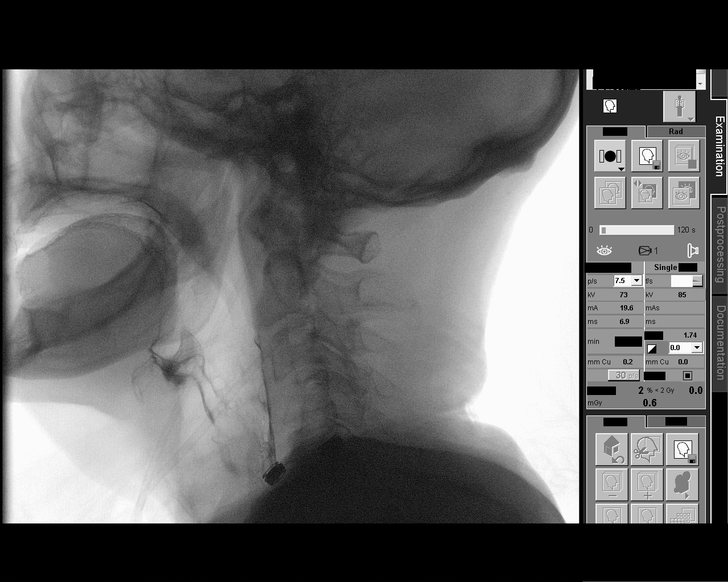

[Series 6: run · 1 of 211 frames shown (5 of 13)]
[frame 61/211]
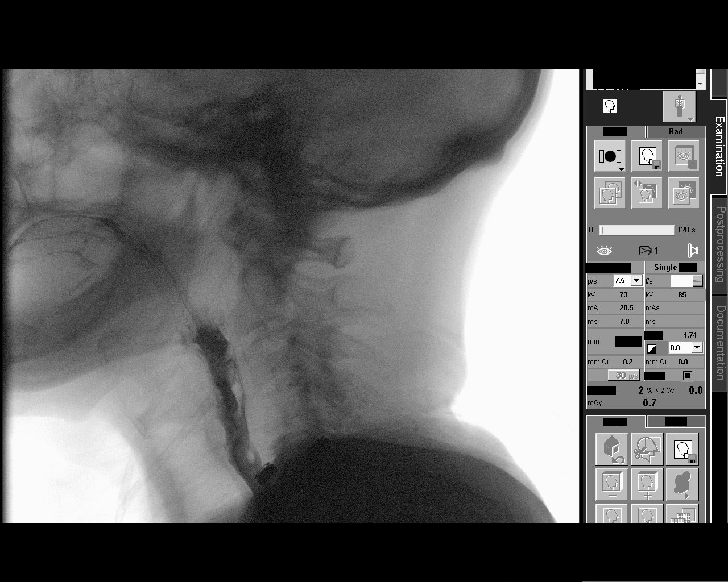

[Series 7: run · 1 of 233 frames shown (6 of 13)]
[frame 117/233]
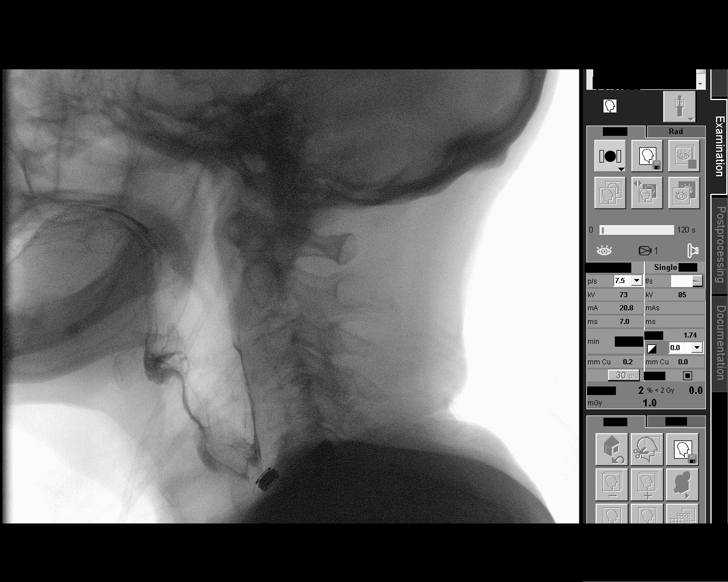

[Series 8: run · 1 of 63 frames shown (7 of 13)]
[frame 54/63]
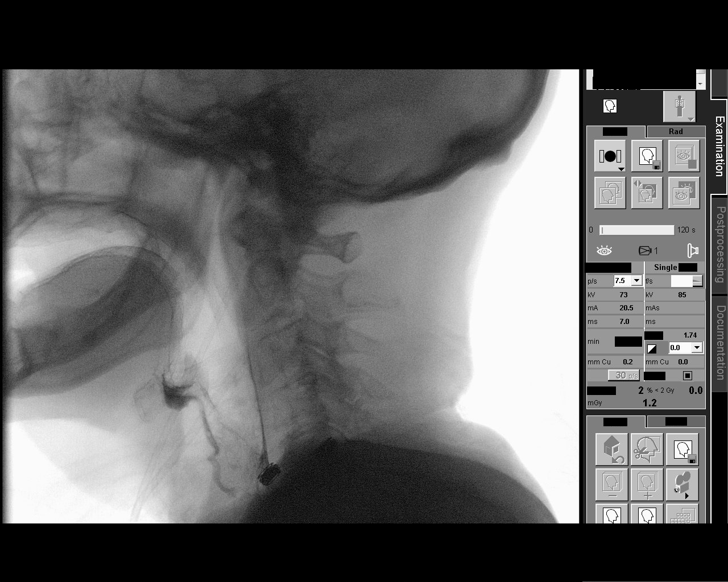

[Series 9: run · 1 of 233 frames shown (8 of 13)]
[frame 48/233]
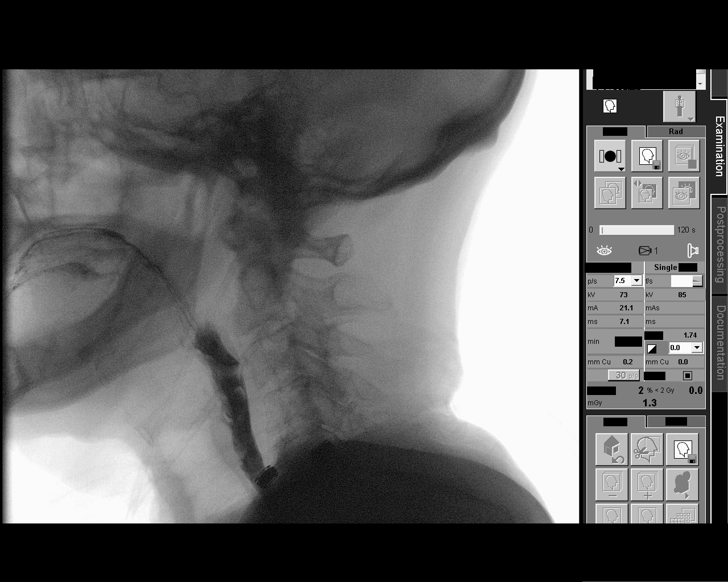

[Series 10: run · 1 of 251 frames shown (9 of 13)]
[frame 126/251]
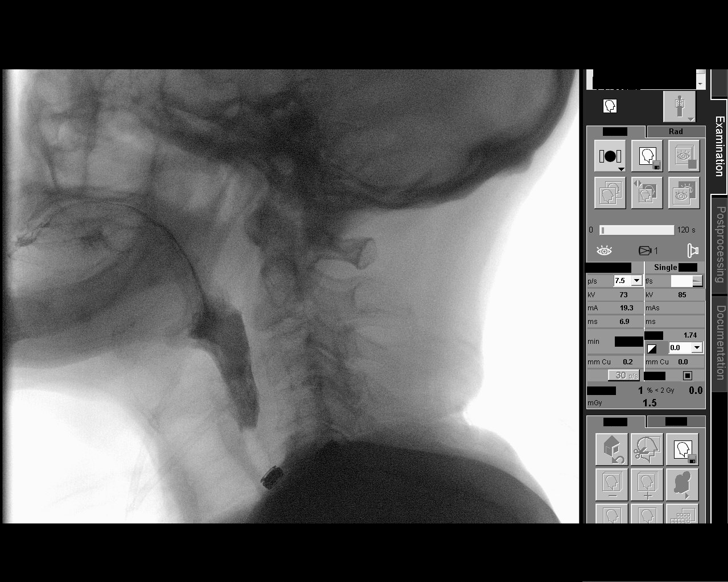

[Series 12: run · 1 of 304 frames shown (10 of 13)]
[frame 46/304]
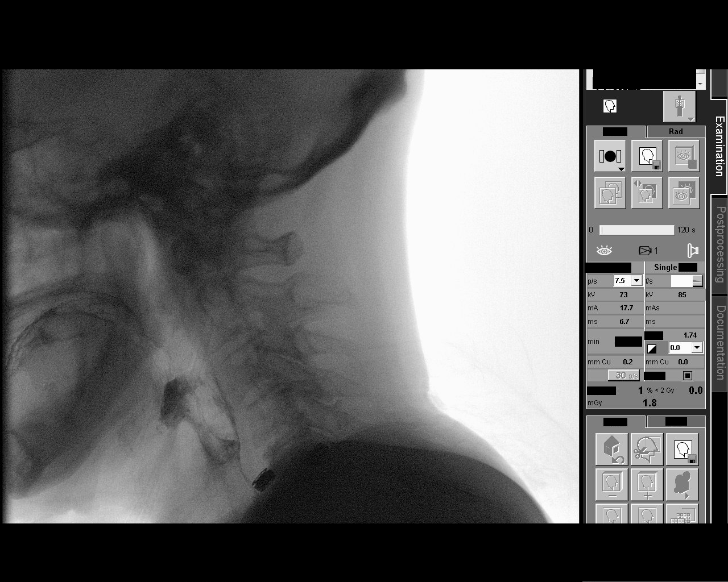

[Series 13: run · 1 of 257 frames shown (11 of 13)]
[frame 39/257]
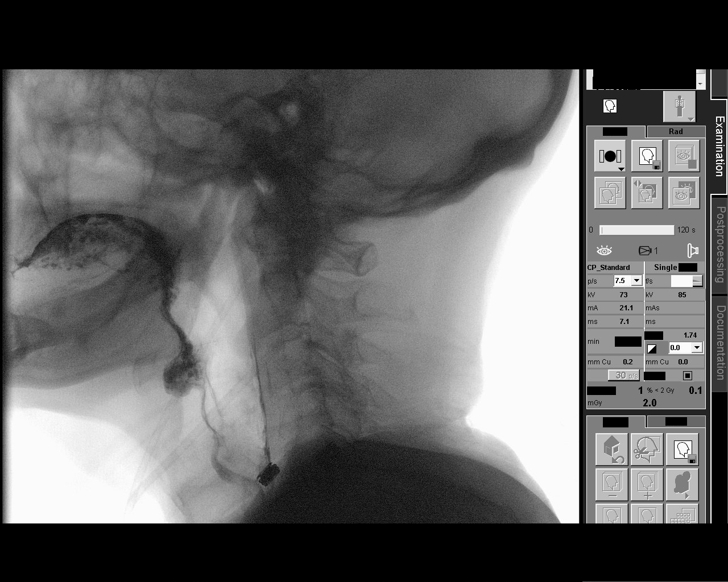

[Series 14: run · 1 of 183 frames shown (12 of 13)]
[frame 92/183]
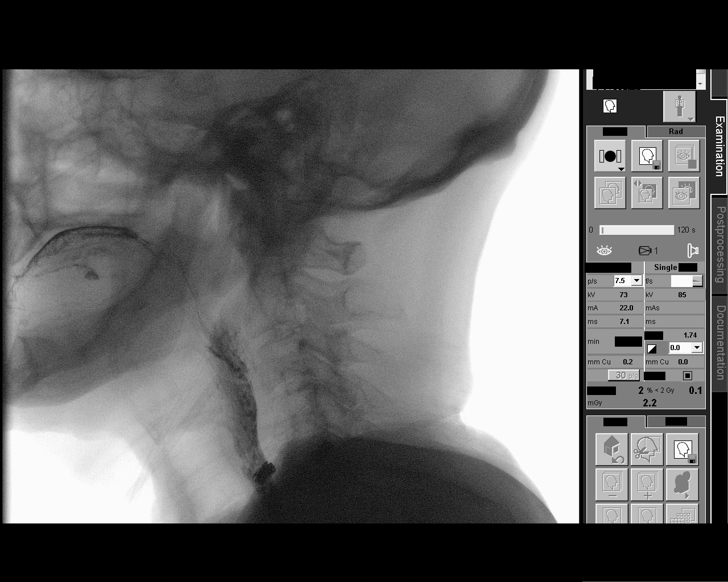

[Series 15: run · 1 of 384 frames shown (13 of 13)]
[frame 327/384]
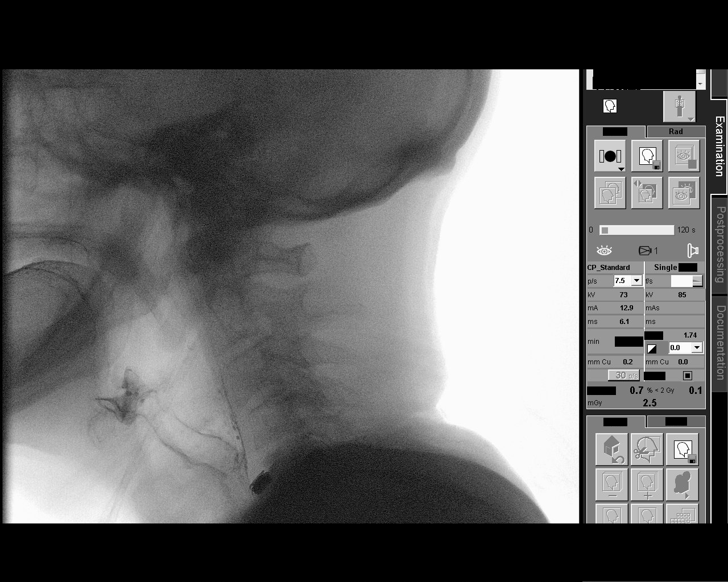

[13 of 24 positions shown; findings below may reference images not displayed]

FLUOROSCOPY FOR SWALLOWING FUNCTION STUDY:
Fluoroscopy was provided for swallowing function study, which was administered by a speech pathologist.  Final results and recommendations from this study are contained within the speech pathology report.
